# Patient Record
Sex: Male | Born: 1955 | Race: Black or African American | Hispanic: No | Marital: Married | Smoking: Never smoker
Health system: Southern US, Community
[De-identification: ages and names within clinical notes are randomized; demographics above are authoritative.]

## PROBLEM LIST (undated history)

## (undated) DIAGNOSIS — I10 Essential (primary) hypertension: Secondary | ICD-10-CM

## (undated) DIAGNOSIS — E119 Type 2 diabetes mellitus without complications: Secondary | ICD-10-CM

## (undated) DIAGNOSIS — E785 Hyperlipidemia, unspecified: Secondary | ICD-10-CM

## (undated) HISTORY — DX: Essential (primary) hypertension: I10

## (undated) HISTORY — DX: Type 2 diabetes mellitus without complications: E11.9

## (undated) HISTORY — DX: Hyperlipidemia, unspecified: E78.5

---

## 2001-01-22 ENCOUNTER — Ambulatory Visit (HOSPITAL_BASED_OUTPATIENT_CLINIC_OR_DEPARTMENT_OTHER): Admission: RE | Admit: 2001-01-22 | Discharge: 2001-01-22 | Payer: Self-pay | Admitting: Orthopaedic Surgery

## 2003-04-01 ENCOUNTER — Encounter: Admission: RE | Admit: 2003-04-01 | Discharge: 2003-06-30 | Payer: Self-pay | Admitting: Family Medicine

## 2006-12-23 IMAGING — CR DG LUMBAR SPINE COMPLETE 4+V
1 series · 6 of 6 positions shown · non-contrast
Comparison: NONE

CLINICAL DATA: Sciatica pain 

LUMBAR SPINE

[Series 1: view not recorded · 0.17mm/px · 6 of 6 slices shown]
[im 1/6]
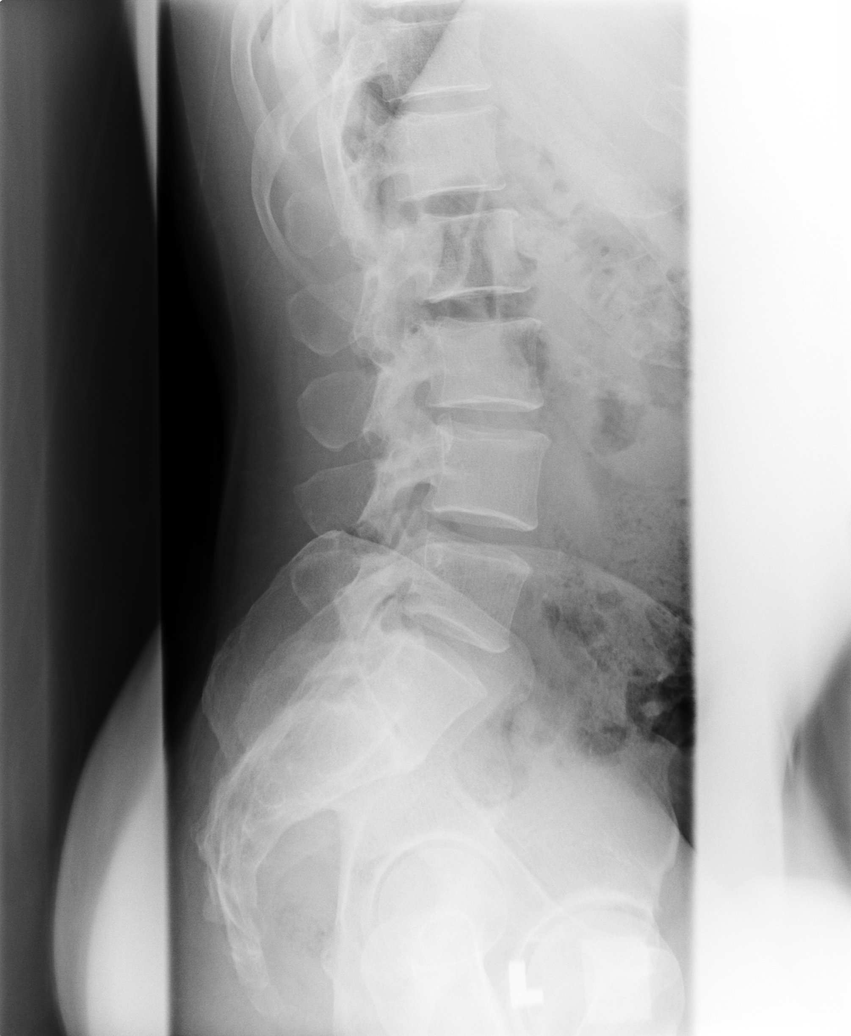
[im 2/6]
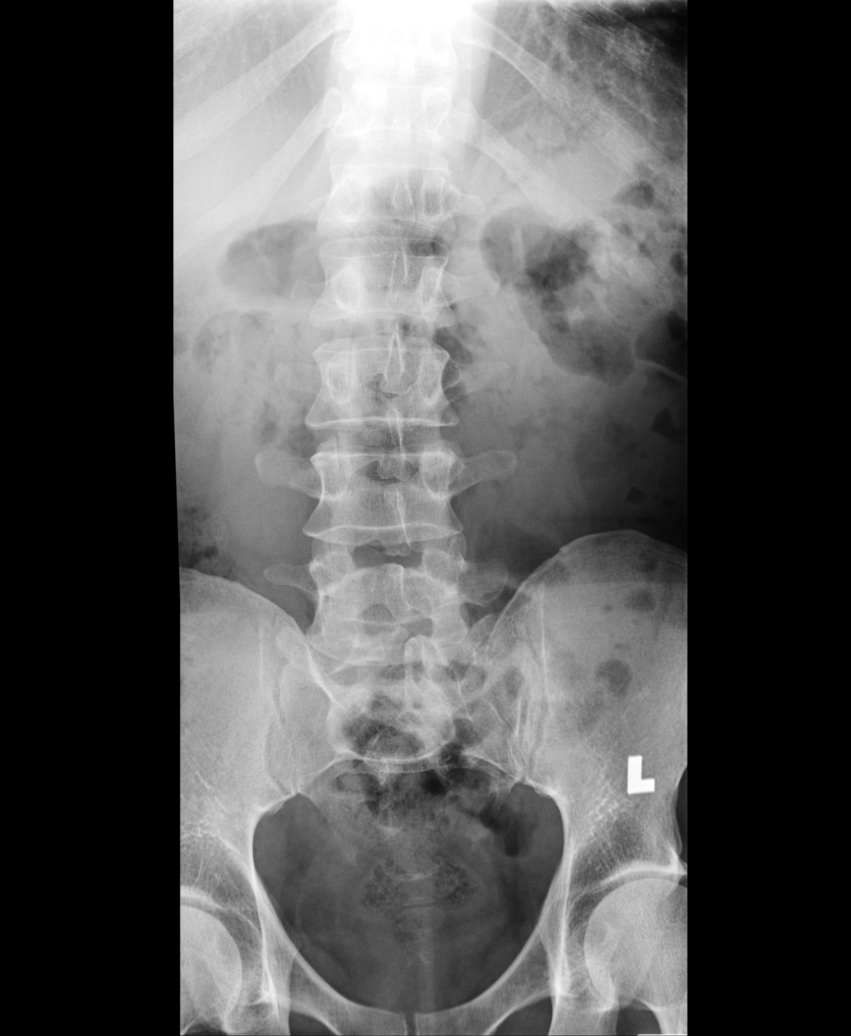
[im 3/6]
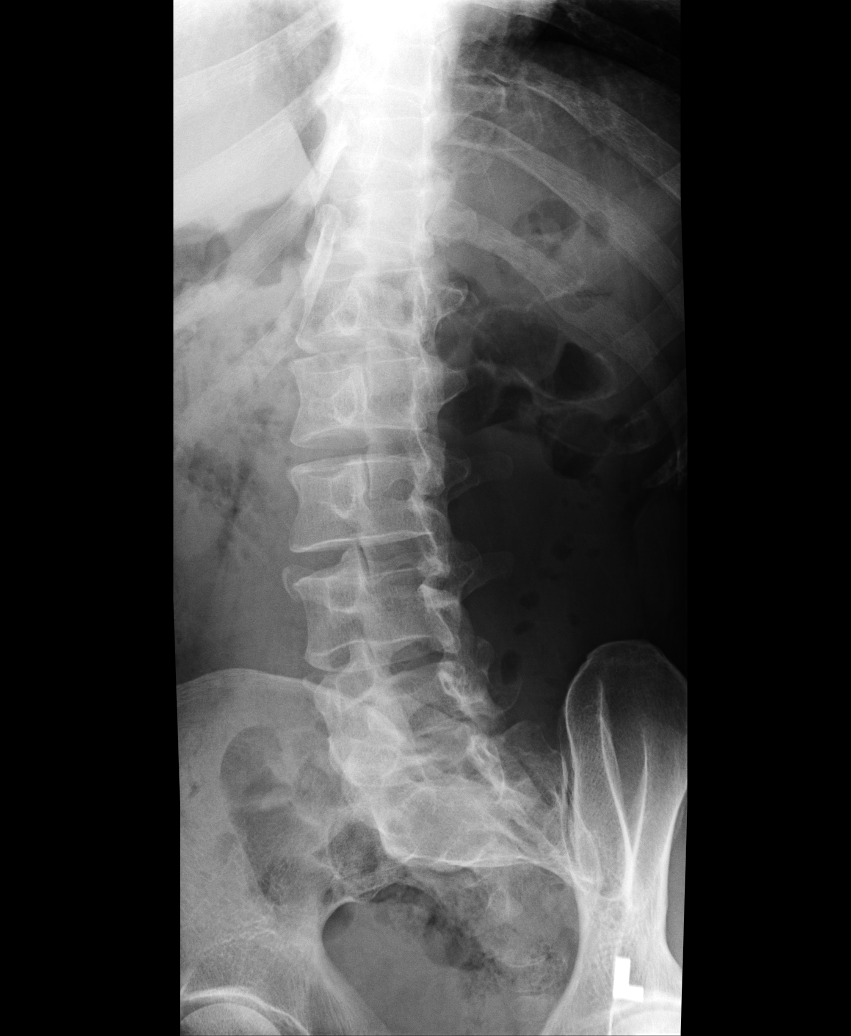
[im 4/6]
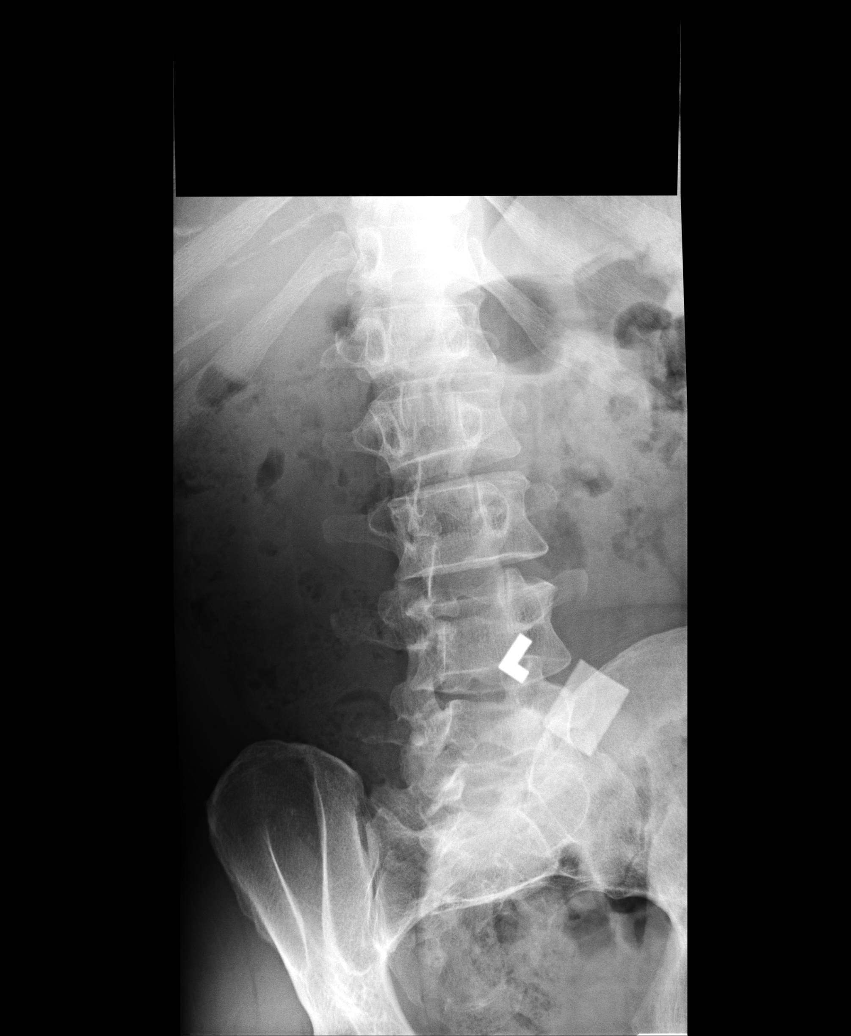
[im 5/6]
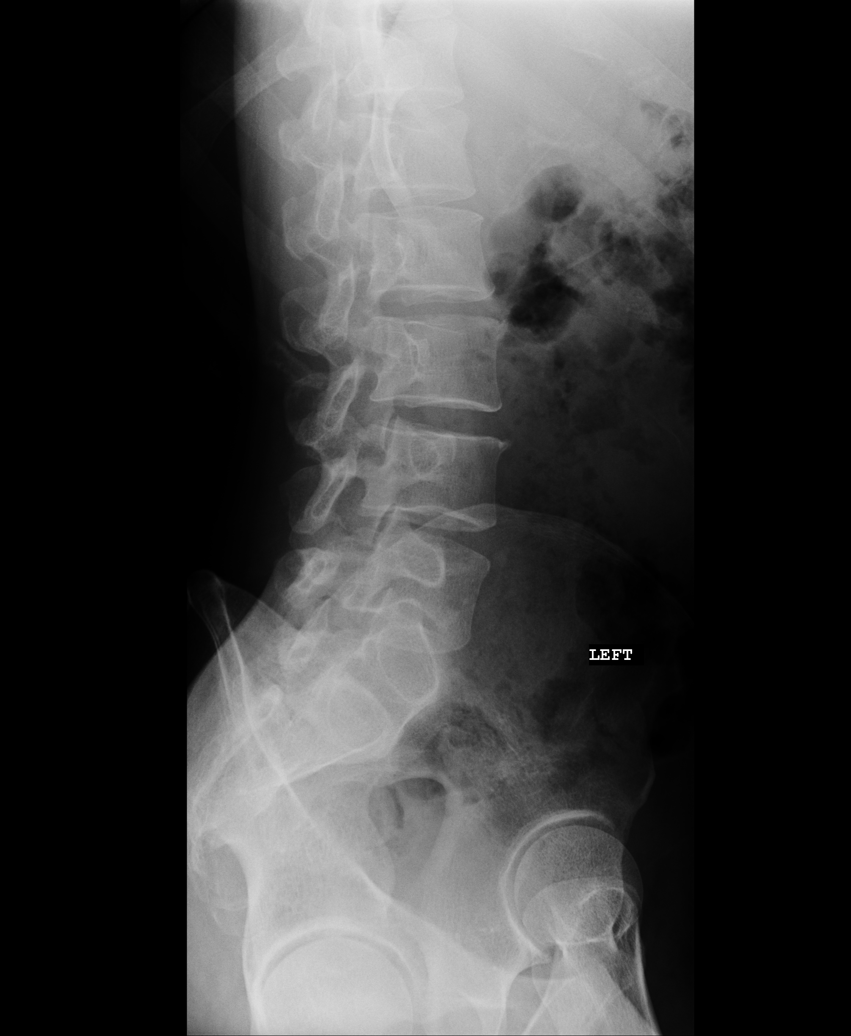
[im 6/6]
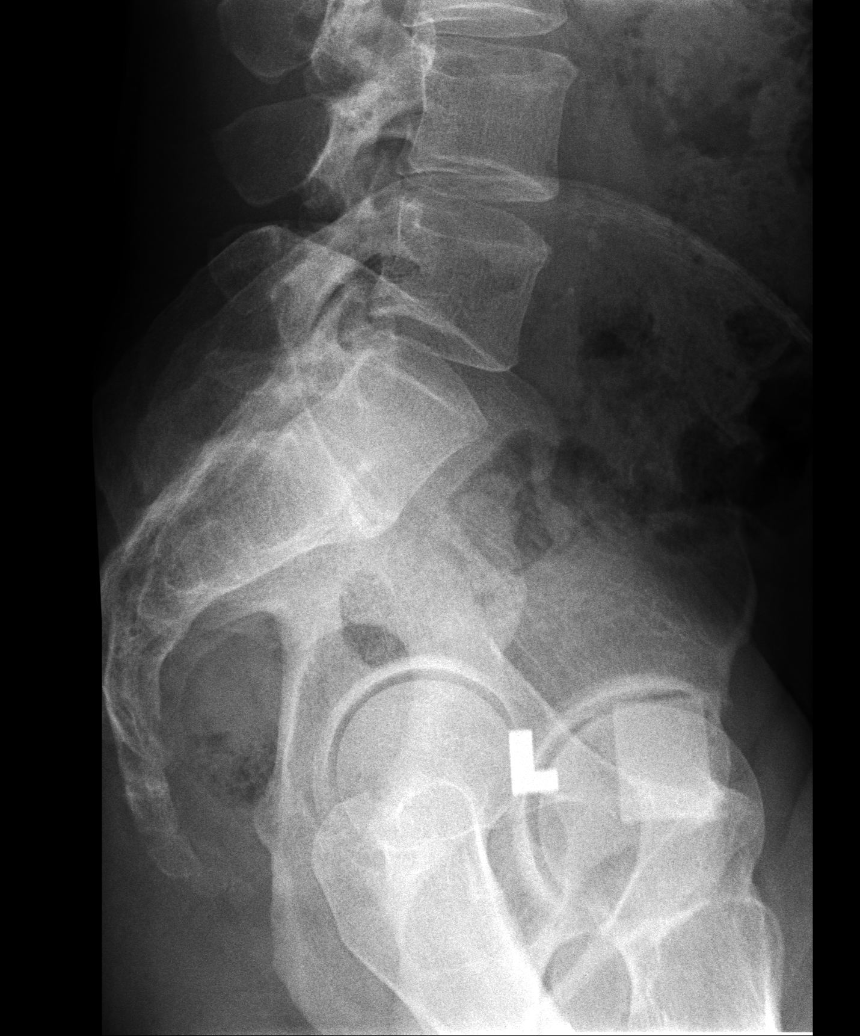

[6 of 6 positions shown; findings below may reference images not displayed]

FINDINGS: Study demonstrates that there is diffuse facet 
arthritic change. No disc height loss or vertebral body deformity 
is seen. Pedicles are seen at all levels.
IMPRESSION: Mild diffuse facet arthritic change. Yesu Ba Ezu, 
Trans Date: 06/01/2006 [REDACTED] [REDACTED]

## 2007-11-18 IMAGING — US US SCROTUM
1 series · 13 of 25 positions shown · non-contrast
Comparison: NONE

CLINICAL DATA: Right testicular irregularity on clinical exam. 

TESTICULAR AND SCROTAL ULTRASOUND

[Series 1: us testicular · 0.08mm/px · 13 of 37 slices shown]
[im 1/37]
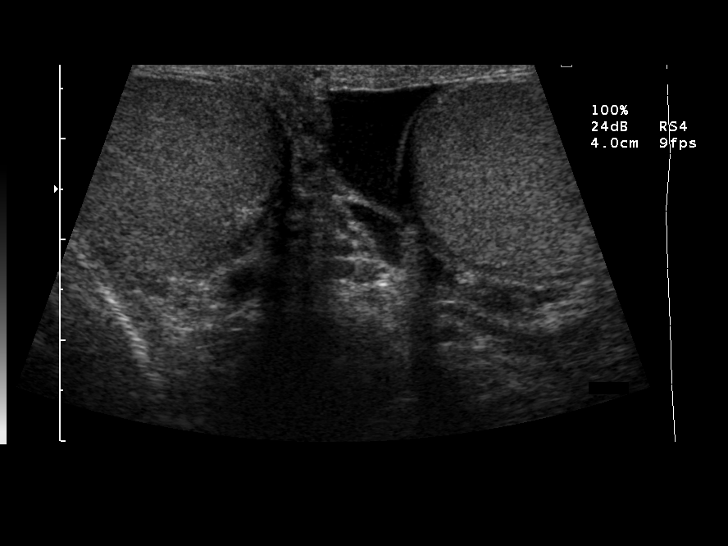
[im 4/37]
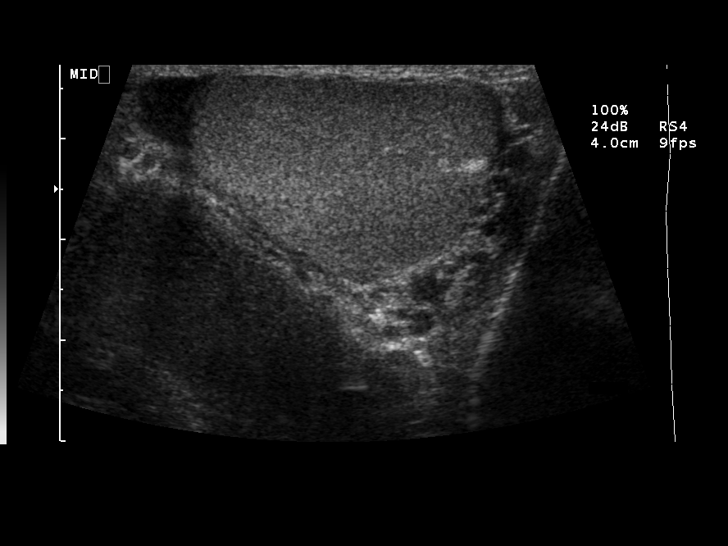
[im 7/37]
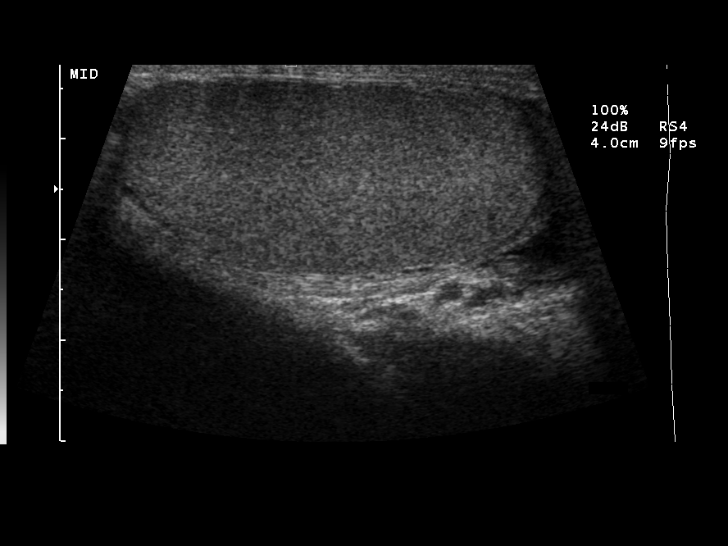
[im 10/37]
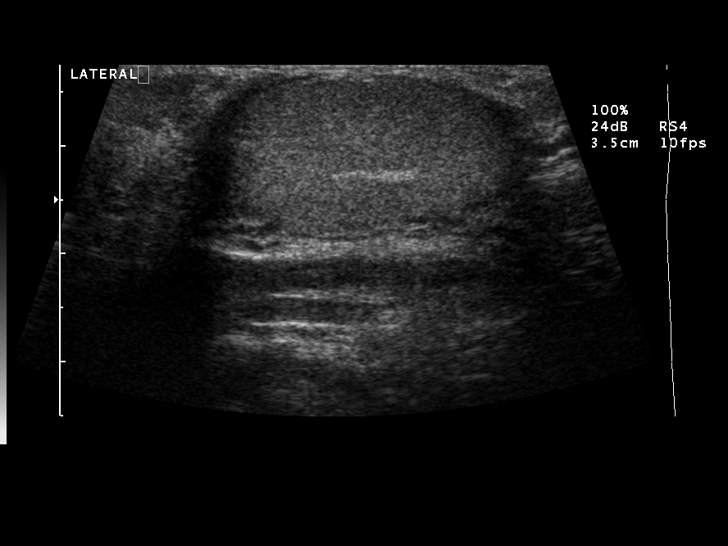
[im 13/37]
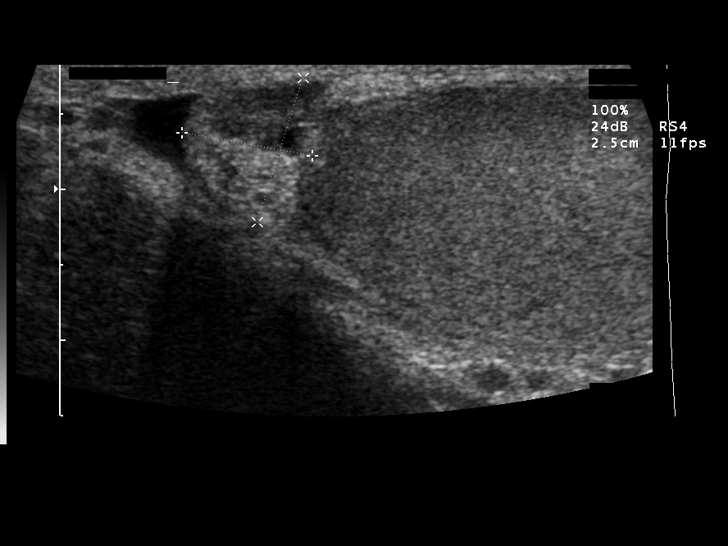
[im 16/37]
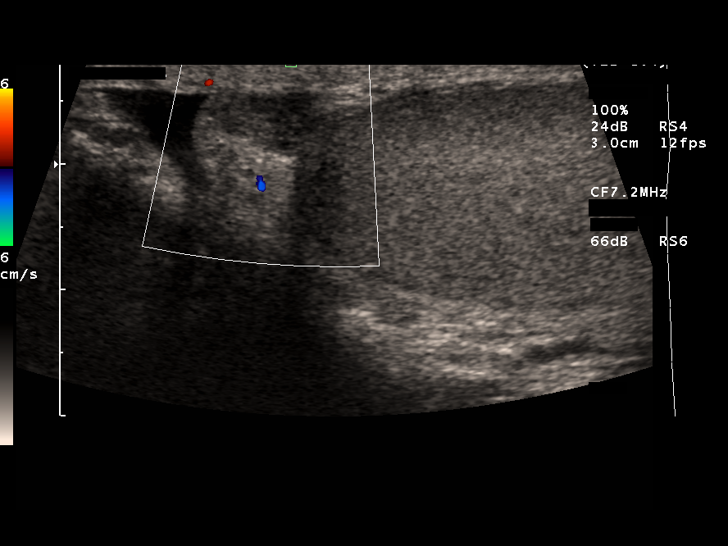
[im 19/37]
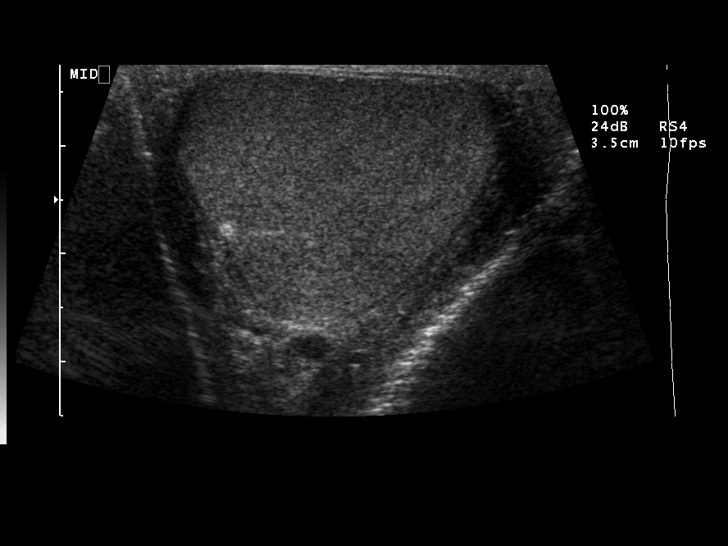
[im 22/37]
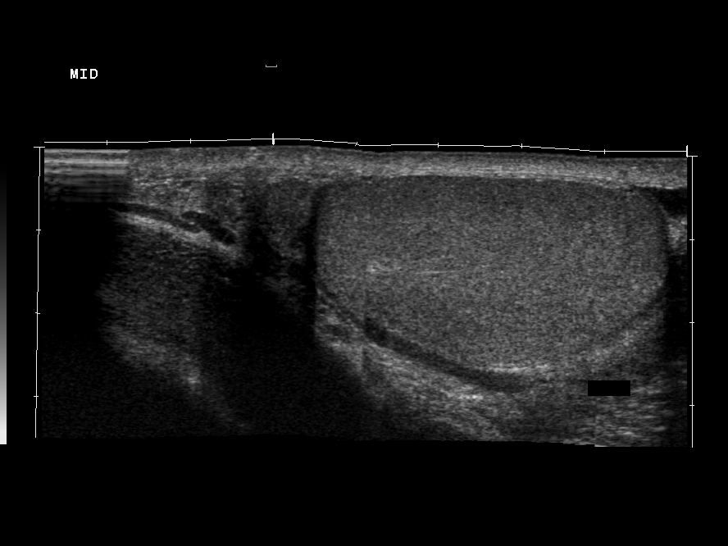
[im 25/37]
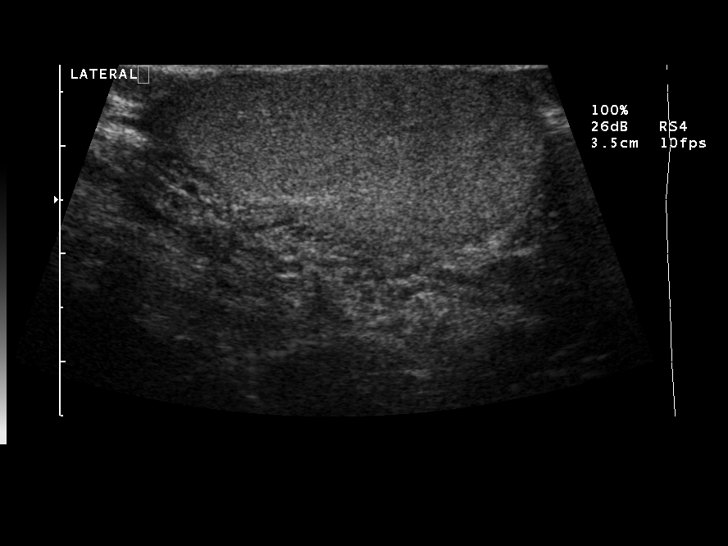
[im 28/37]
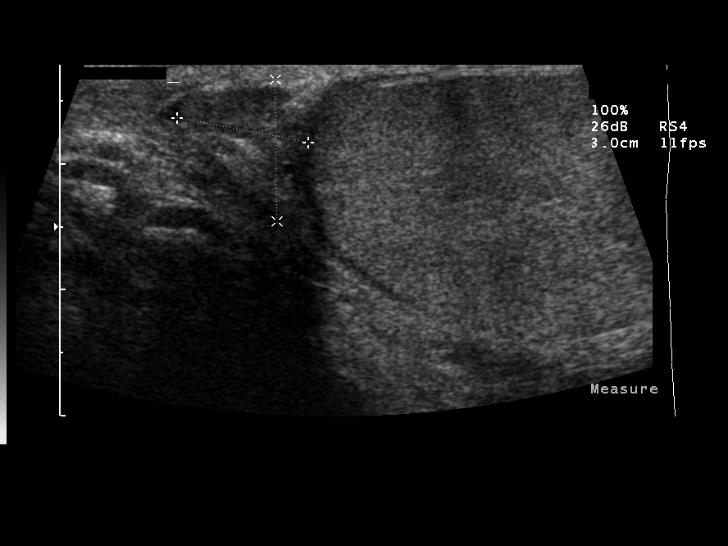
[im 31/37]
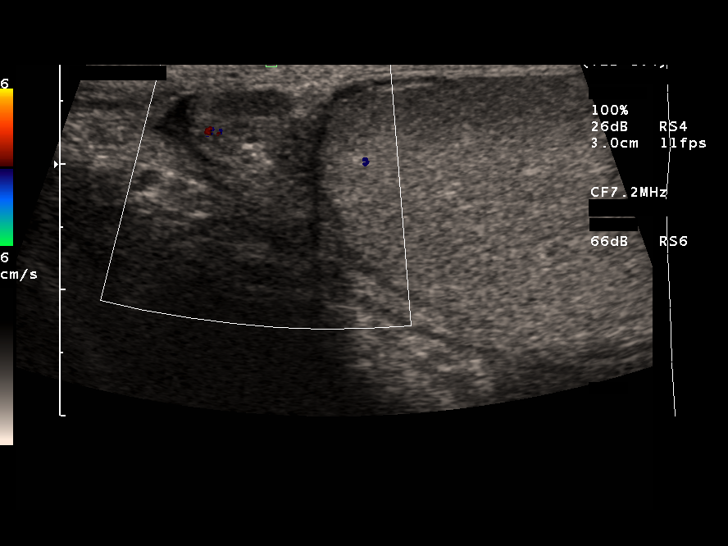
[im 34/37]
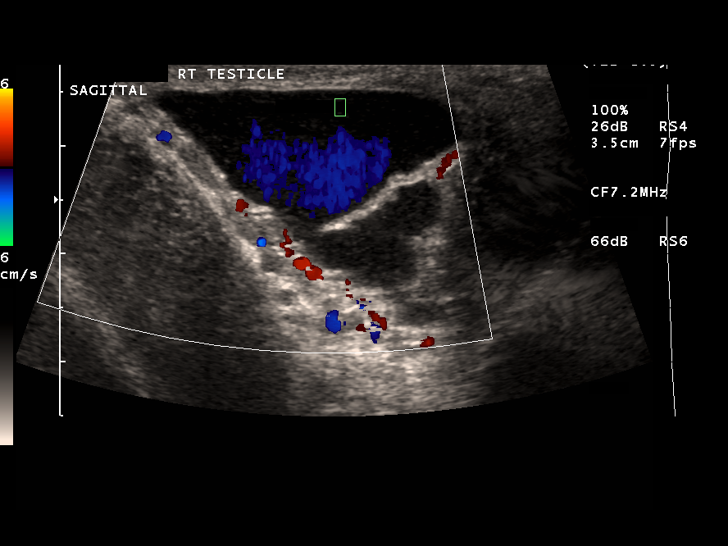
[im 37/37]
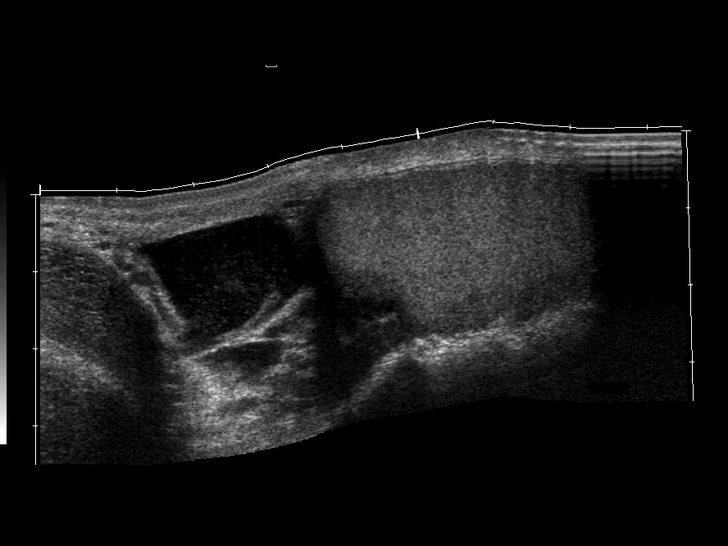

[13 of 25 positions shown; findings below may reference images not displayed]

FINDINGS RIGHT TESTICLE: 4.3 x 2.3 x 3.0 cm LEFT TESTICLE: 4.3 x 
1.9 x 3.0 cm The right testicle was normal in size and echo 
appearance and demonstrated normal vascularity. The right 
epididymal head was normal in size and echo appearance measuring 
1.1 x 1.0 x 1.3 cm. Located superior to the right testicle within 
the scrotal sac, there was a septated fluid collection containing 
low-level echos consistent with debris. Appearances are consistent 
with a hydrocele. This measures 3.9 x 2.1 x 1.0 cm. The left 
testicle was normal in size and echo appearance with normal 
vascularity. The left epididymal head was normal in size measuring 
0.9 x 1.0 x 2.2 cm. Echo heterogeneity was noted. This can be a 
finding indicating prior episodes of inflammation. A moderate 
amount of fluid is seen medial to the left testicle.
IMPRESSION: Normal-appearing testicles bilaterally. Fluid 
collections are noted in both scrotal sacs, predominating on the 
right. The right-side fluid collection contains low-level echos 
consistent with debris, suggesting that this is a chronic finding. 
The fluid does localize toward the medial aspects of both scrotal 
sacs. Heterogeneous echo appearance of the left epididymal head 
suggests probable prior episodes of epididymitis. Masuood Morteza 
Nga Chun, M.D. electronically reviewed on 04/29/2007 Dict Date: 
04/26/2007  Tran Date: 04/29/2007 CAV  [REDACTED]

## 2014-06-05 ENCOUNTER — Ambulatory Visit (INDEPENDENT_AMBULATORY_CARE_PROVIDER_SITE_OTHER): Payer: 59

## 2014-06-05 ENCOUNTER — Encounter: Payer: Self-pay | Admitting: Podiatry

## 2014-06-05 ENCOUNTER — Ambulatory Visit (INDEPENDENT_AMBULATORY_CARE_PROVIDER_SITE_OTHER): Payer: 59 | Admitting: Podiatry

## 2014-06-05 VITALS — BP 127/74 | HR 63 | Resp 11 | Ht 68.0 in | Wt 155.0 lb

## 2014-06-05 DIAGNOSIS — M79673 Pain in unspecified foot: Secondary | ICD-10-CM

## 2014-06-05 DIAGNOSIS — M722 Plantar fascial fibromatosis: Secondary | ICD-10-CM

## 2014-06-05 DIAGNOSIS — Q828 Other specified congenital malformations of skin: Secondary | ICD-10-CM

## 2014-06-05 NOTE — Progress Notes (Addendum)
   Subjective:    Patient ID: Roger Roberts, male    DOB: 20-Nov-1955, 58 y.o.   MRN: [id number]  HPI 58 year old male presents the office with complaints of bilateral foot pain which has been ongoing for approximately 6 months. 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 He states he has pain particularly in the heels bilaterally as well as calluses overlying the bottom of the feet. Patient also states that he has bilateral knee pain as well as right hip pain. Patient also complains of sciatica-like symptoms. Patient is diabetic and he states his last blood sugar is typically in the 120s in the morning. He denies any tearing or numbness to his feet and denies any claudication symptoms. Denies any recent ulcerations. No other complaints at this time.   Review of Systems  All other systems reviewed and are negative.      Objective:   Physical Exam AAO 3, NAD DP/PT pulses palpable bilaterally, CRT less than 3 seconds Protective sensation appears to be intact with Derrel Nip monofilament, vibratory sensation intact, Achilles tendon reflex intact. There is mild tenderness palpation of the plantar medial tubercle of the calcaneus at the insertion of the plantar fascia. There is no pain within the arch of the foot along the course of plantar fascia and the ligament appears to be intact. There is no pain with lateral compression of the calcaneus or pain with vibratory sensation. No pain on the posterior aspect of the calcaneus or along the course/insertion of the Achilles tendon. There is hyperkeratotic lesions bilateral submetatarsal 5 as well as posterior lateral aspect of the right heel. There is no underlying ulcerations or signs of infection. There is no edema, erythema, increased warmth to bilateral lower chemise. No areas of pinpoint tenderness or pain with vibratory sensation. MMT 5/5, ROM WNL No pain with calf compression, swelling,  warmth, erythema.      Assessment & Plan:  58 year old male with bilateral heel pain, likely plantar fasciitis; symptomatic porokeratosis -X-rays were obtained and reviewed with the patient. -Treatment options were discussed the patient including alternatives, risks, complications. -Discussed steroid injections to the heels. Patient wishes to hold off on them at this time. Will consider if no improvements at next appointment.  -Dispensed plantar fascial braces. -Ice to the area. -Discussed stretching exercises. -Discussed shoe gear modification and possible orthotics to help support his foot type. -Porokeratosis sharply debrided 3 without complications. -Follow-up in 2 weeks or sooner should any palms arise. In the meantime, call the office with any questions, concerns, change in symptoms. -Recommended follow-up with orthopedics for hip/knee pain.

## 2014-06-05 NOTE — Patient Instructions (Signed)
Diabetes and Foot Care Diabetes may cause you to have problems because of poor blood supply (circulation) to your feet and legs. This may cause the skin on your feet to become thinner, break easier, and heal more slowly. Your skin may become dry, and the skin may peel and crack. You may also have nerve damage in your legs and feet causing decreased feeling in them. You may not notice minor injuries to your feet that could lead to infections or more serious problems. Taking care of your feet is one of the most important things you can do for yourself.  HOME CARE INSTRUCTIONS  Wear shoes at all times, even in the house. Do not go barefoot. Bare feet are easily injured.  Check your feet daily for blisters, cuts, and redness. If you cannot see the bottom of your feet, use a mirror or ask someone for help.  Wash your feet with warm water (do not use hot water) and mild soap. Then pat your feet and the areas between your toes until they are completely dry. Do not soak your feet as this can dry your skin.  Apply a moisturizing lotion or petroleum jelly (that does not contain alcohol and is unscented) to the skin on your feet and to dry, brittle toenails. Do not apply lotion between your toes.  Trim your toenails straight across. Do not dig under them or around the cuticle. File the edges of your nails with an emery board or nail file.  Do not cut corns or calluses or try to remove them with medicine.  Wear clean socks or stockings every day. Make sure they are not too tight. Do not wear knee-high stockings since they may decrease blood flow to your legs.  Wear shoes that fit properly and have enough cushioning. To break in new shoes, wear them for just a few hours a day. This prevents you from injuring your feet. Always look in your shoes before you put them on to be sure there are no objects inside.  Do not cross your legs. This may decrease the blood flow to your feet.  If you find a minor  scrape, cut, or break in the skin on your feet, keep it and the skin around it clean and dry. These areas may be cleansed with mild soap and water. Do not cleanse the area with peroxide, alcohol, or iodine.  When you remove an adhesive bandage, be sure not to damage the skin around it.  If you have a wound, look at it several times a day to make sure it is healing.  Do not use heating pads or hot water bottles. They may burn your skin. If you have lost feeling in your feet or legs, you may not know it is happening until it is too late.  Make sure your health care provider performs a complete foot exam at least annually or more often if you have foot problems. Report any cuts, sores, or bruises to your health care provider immediately. SEEK MEDICAL CARE IF:   You have an injury that is not healing.  You have cuts or breaks in the skin.  You have an ingrown nail.  You notice redness on your legs or feet.  You feel burning or tingling in your legs or feet.  You have pain or cramps in your legs and feet.  Your legs or feet are numb.  Your feet always feel cold. SEEK IMMEDIATE MEDICAL CARE IF:   There is  increasing redness, swelling, or pain in or around a wound.  There is a red line that goes up your leg.  Pus is coming from a wound.  You develop a fever or as directed by your health care provider.  You notice a bad smell coming from an ulcer or wound. Document Released: 06/02/2000 Document Revised: 02/05/2013 Document Reviewed: 11/12/2012 [facility] Patient Information 2015 [place], [place]. This information is not intended to replace advice given to you by your health care provider. Make sure you discuss any questions you have with your health care provider. Plantar Fasciitis (Heel Spur Syndrome) with Rehab The plantar fascia is a fibrous, ligament-like, soft-tissue structure that spans the bottom of the foot. Plantar fasciitis is a condition that causes pain in the foot due to  inflammation of the tissue. SYMPTOMS   Pain and tenderness on the underneath side of the foot.  Pain that worsens with standing or walking. CAUSES  Plantar fasciitis is caused by irritation and injury to the plantar fascia on the underneath side of the foot. Common mechanisms of injury include:  Direct trauma to bottom of the foot.  Damage to a small nerve that runs under the foot where the main fascia attaches to the heel bone.  Stress placed on the plantar fascia due to bone spurs. RISK INCREASES WITH:   Activities that place stress on the plantar fascia (running, jumping, pivoting, or cutting).  Poor strength and flexibility.  Improperly fitted shoes.  Tight calf muscles.  Flat feet.  Failure to warm-up properly before activity.  Obesity. PREVENTION  Warm up and stretch properly before activity.  Allow for adequate recovery between workouts.  Maintain physical fitness:  Strength, flexibility, and endurance.  Cardiovascular fitness.  Maintain a health body weight.  Avoid stress on the plantar fascia.  Wear properly fitted shoes, including arch supports for individuals who have flat feet. PROGNOSIS  If treated properly, then the symptoms of plantar fasciitis usually resolve without surgery. However, occasionally surgery is necessary. RELATED COMPLICATIONS   Recurrent symptoms that may result in a chronic condition.  Problems of the lower back that are caused by compensating for the injury, such as limping.  Pain or weakness of the foot during push-off following surgery.  Chronic inflammation, scarring, and partial or complete fascia tear, occurring more often from repeated injections. TREATMENT  Treatment initially involves the use of ice and medication to help reduce pain and inflammation. The use of strengthening and stretching exercises may help reduce pain with activity, especially stretches of the Achilles tendon. These exercises may be performed at  home or with a therapist. Your caregiver may recommend that you use heel cups of arch supports to help reduce stress on the plantar fascia. Occasionally, corticosteroid injections are given to reduce inflammation. If symptoms persist for greater than 6 months despite non-surgical (conservative), then surgery may be recommended.  MEDICATION   If pain medication is necessary, then nonsteroidal anti-inflammatory medications, such as aspirin and ibuprofen, or other minor pain relievers, such as acetaminophen, are often recommended.  Do not take pain medication within 7 days before surgery.  Prescription pain relievers may be given if deemed necessary by your caregiver. Use only as directed and only as much as you need.  Corticosteroid injections may be given by your caregiver. These injections should be reserved for the most serious cases, because they may only be given a certain number of times. HEAT AND COLD  Cold treatment (icing) relieves pain and reduces inflammation. Cold treatment should  be applied for 10 to 15 minutes every 2 to 3 hours for inflammation and pain and immediately after any activity that aggravates your symptoms. Use ice packs or massage the area with a piece of ice (ice massage).  Heat treatment may be used prior to performing the stretching and strengthening activities prescribed by your caregiver, physical therapist, or athletic trainer. Use a heat pack or soak the injury in warm water. SEEK IMMEDIATE MEDICAL CARE IF:  Treatment seems to offer no benefit, or the condition worsens.  Any medications produce adverse side effects. EXERCISES RANGE OF MOTION (ROM) AND STRETCHING EXERCISES - Plantar Fasciitis (Heel Spur Syndrome) These exercises may help you when beginning to rehabilitate your injury. Your symptoms may resolve with or without further involvement from your physician, physical therapist or athletic trainer. While completing these exercises, remember:   Restoring  tissue flexibility helps normal motion to return to the joints. This allows healthier, less painful movement and activity.  An effective stretch should be held for at least 30 seconds.  A stretch should never be painful. You should only feel a gentle lengthening or release in the stretched tissue. RANGE OF MOTION - Toe Extension, Flexion  Sit with your right / left leg crossed over your opposite knee.  Grasp your toes and gently pull them back toward the top of your foot. You should feel a stretch on the bottom of your toes and/or foot.  Hold this stretch for __________ seconds.  Now, gently pull your toes toward the bottom of your foot. You should feel a stretch on the top of your toes and or foot.  Hold this stretch for __________ seconds. Repeat __________ times. Complete this stretch __________ times per day.  RANGE OF MOTION - Ankle Dorsiflexion, Active Assisted  Remove shoes and sit on a chair that is preferably not on a carpeted surface.  Place right / left foot under knee. Extend your opposite leg for support.  Keeping your heel down, slide your right / left foot back toward the chair until you feel a stretch at your ankle or calf. If you do not feel a stretch, slide your bottom forward to the edge of the chair, while still keeping your heel down.  Hold this stretch for __________ seconds. Repeat __________ times. Complete this stretch __________ times per day.  STRETCH - Gastroc, Standing  Place hands on wall.  Extend right / left leg, keeping the front knee somewhat bent.  Slightly point your toes inward on your back foot.  Keeping your right / left heel on the floor and your knee straight, shift your weight toward the wall, not allowing your back to arch.  You should feel a gentle stretch in the right / left calf. Hold this position for __________ seconds. Repeat __________ times. Complete this stretch __________ times per day. STRETCH - Soleus, Standing  Place  hands on wall.  Extend right / left leg, keeping the other knee somewhat bent.  Slightly point your toes inward on your back foot.  Keep your right / left heel on the floor, bend your back knee, and slightly shift your weight over the back leg so that you feel a gentle stretch deep in your back calf.  Hold this position for __________ seconds. Repeat __________ times. Complete this stretch __________ times per day. STRETCH - Gastrocsoleus, Standing  Note: This exercise can place a lot of stress on your foot and ankle. Please complete this exercise only if specifically instructed by your caregiver.  Place the ball of your right / left foot on a step, keeping your other foot firmly on the same step.  Hold on to the wall or a rail for balance.  Slowly lift your other foot, allowing your body weight to press your heel down over the edge of the step.  You should feel a stretch in your right / left calf.  Hold this position for __________ seconds.  Repeat this exercise with a slight bend in your right / left knee. Repeat __________ times. Complete this stretch __________ times per day.  STRENGTHENING EXERCISES - Plantar Fasciitis (Heel Spur Syndrome)  These exercises may help you when beginning to rehabilitate your injury. They may resolve your symptoms with or without further involvement from your physician, physical therapist or athletic trainer. While completing these exercises, remember:   Muscles can gain both the endurance and the strength needed for everyday activities through controlled exercises.  Complete these exercises as instructed by your physician, physical therapist or athletic trainer. Progress the resistance and repetitions only as guided. STRENGTH - Towel Curls  Sit in a chair positioned on a non-carpeted surface.  Place your foot on a towel, keeping your heel on the floor.  Pull the towel toward your heel by only curling your toes. Keep your heel on the  floor.  If instructed by your physician, physical therapist or athletic trainer, add ____________________ at the end of the towel. Repeat __________ times. Complete this exercise __________ times per day. STRENGTH - Ankle Inversion  Secure one end of a rubber exercise band/tubing to a fixed object (table, pole). Loop the other end around your foot just before your toes.  Place your fists between your knees. This will focus your strengthening at your ankle.  Slowly, pull your big toe up and in, making sure the band/tubing is positioned to resist the entire motion.  Hold this position for __________ seconds.  Have your muscles resist the band/tubing as it slowly pulls your foot back to the starting position. Repeat __________ times. Complete this exercises __________ times per day.  Document Released: 06/05/2005 Document Revised: 08/28/2011 Document Reviewed: 09/17/2008 [facility] Patient Information 2015 [place], [place]. This information is not intended to replace advice given to you by your health care provider. Make sure you discuss any questions you have with your health care provider.

## 2014-06-08 ENCOUNTER — Encounter: Payer: Self-pay | Admitting: Podiatry

## 2014-06-18 ENCOUNTER — Ambulatory Visit: Payer: 59 | Admitting: Podiatry

## 2014-07-01 ENCOUNTER — Encounter: Payer: Self-pay | Admitting: Podiatry

## 2014-07-01 ENCOUNTER — Ambulatory Visit (INDEPENDENT_AMBULATORY_CARE_PROVIDER_SITE_OTHER): Payer: 59 | Admitting: Podiatry

## 2014-07-01 VITALS — BP 84/54 | HR 75 | Resp 18

## 2014-07-01 DIAGNOSIS — M722 Plantar fascial fibromatosis: Secondary | ICD-10-CM

## 2014-07-01 MED ORDER — DICLOFENAC SODIUM 1 % TD GEL: 2.0000 g | Freq: Four times a day (QID) | TRANSDERMAL | Status: AC

## 2014-07-01 NOTE — Progress Notes (Signed)
Patient ID: Roger Roberts, male   DOB: [birth year], 59 y.o.   MRN: [id number]  Subjective: 59 year old male returns the office they for follow-up evaluation of bilateral heel pain, likely plantar fasciitis. He states that since last appointment his feet are better. He has been continuing the stretching exercises as well as icing. He is continue with shoe gear changes and over-the-counter inserts. No recent injury or trauma to bilateral lower extremities. Denies any systemic complaints such as fevers, chills, nausea, vomiting. No acute changes since last appointment, and no other complaints at this time.   Objective: AAO x3, NAD DP/PT pulses palpable bilaterally, CRT less than 3 seconds Protective sensation intact with Simms Weinstein monofilament, vibratory sensation intact, Achilles tendon reflex intact Is mild discomfort on the plantar aspect of the heel bilaterally at the insertion of the plantar fascial. There is no pain on the course of plantar fascial in the arch of the foot. No pain with lateral compression of the calcaneus or pain the vibratory sensation. No pain on the posterior acid of the calcaneus or along the course/insertion of the Achilles tendon. No other areas of pinpoint bony tenderness or pain with vibratory sensation. MMT 5/5, ROM WNL. No edema, erythema, increase in warmth to bilateral lower extremities.  No open lesions or pre-ulcerative lesions.  No pain with calf compression, swelling, warmth, erythema  Assessment: 59 year old male with resolving bilateral heel pain, likely plantar fasciitis.  Plan: -All treatment options discussed with the patient including all alternatives, risks, complications.  -Discussed possible steroid injection to the area however patient wishes to hold off at this time as his symptoms are improving. -Continue with stretching exercises as well as icing, shoe gear modifications, inserts. -Prescribed Voltaren gel. -Follow-up if the symptoms have not  resolved within 4 weeks or sooner should any problems arise or any changes symptoms. -Patient encouraged to call the office with any questions, concerns, change in symptoms.

## 2015-03-26 ENCOUNTER — Telehealth: Payer: Self-pay | Admitting: *Deleted

## 2015-03-26 NOTE — Telephone Encounter (Signed)
Prior authorization not needed for brand Voltaren 1% Gel, refernce# [id number].  Faxed to [employer] [phone].

## 2016-07-13 DIAGNOSIS — R972 Elevated prostate specific antigen [PSA]: Secondary | ICD-10-CM | POA: Diagnosis not present

## 2016-07-18 DIAGNOSIS — H6692 Otitis media, unspecified, left ear: Secondary | ICD-10-CM | POA: Diagnosis not present

## 2016-08-01 DIAGNOSIS — C61 Malignant neoplasm of prostate: Secondary | ICD-10-CM | POA: Diagnosis not present

## 2016-08-01 DIAGNOSIS — R972 Elevated prostate specific antigen [PSA]: Secondary | ICD-10-CM | POA: Diagnosis not present

## 2016-08-03 ENCOUNTER — Other Ambulatory Visit: Payer: Self-pay | Admitting: Urology

## 2016-08-29 DIAGNOSIS — M6281 Muscle weakness (generalized): Secondary | ICD-10-CM | POA: Diagnosis not present

## 2016-08-29 DIAGNOSIS — M1990 Unspecified osteoarthritis, unspecified site: Secondary | ICD-10-CM | POA: Diagnosis not present

## 2016-08-29 DIAGNOSIS — C61 Malignant neoplasm of prostate: Secondary | ICD-10-CM | POA: Diagnosis not present

## 2016-09-07 NOTE — Patient Instructions (Addendum)
Roger Roberts  09/07/2016   Your procedure is scheduled on: 09-13-16  Report to [facility] Main  Entrance take [facility]  elevators to 3rd floor to  [place] at [phone].  Call this number if you have problems the morning of surgery [phone]   Remember: ONLY 1 PERSON MAY GO WITH YOU TO SHORT STAY TO GET  READY MORNING OF [place].  Do not eat food After Midnight on Monday 09-08-16. Drink plenty of clear liquids all day Tuesday 09-12-16 and follow all of Dr Herrick's bowel prep instructions you. Nothing by mouth after midnight Tuesday !!     Take these medicines the morning of surgery with A SIP OF WATER: none  DO NOT TAKE ANY DIABETIC MEDICATIONS DAY OF YOUR SURGERY                               You may not have any metal on your body including hair pins and              piercings  Do not wear jewelry, make-up, lotions, powders or perfumes, deodorant             Do not wear nail polish.  Do not shave  48 hours prior to surgery.              Men may shave face and neck.   Do not bring valuables to the hospital. [place].  Contacts, dentures or bridgework may not be worn into surgery.  Leave suitcase in the car. After surgery it may be brought to your room.                Please read over the following fact sheets you were given: _____________________________________________________________________    CLEAR LIQUID DIET   Foods Allowed                                                                     Foods Excluded  Coffee and tea, regular and decaf                             liquids that you cannot  Plain Jell-O in any flavor                                             see through such as: Fruit ices (not with fruit pulp)                                     milk, soups, orange juice  Iced Popsicles  All solid food Carbonated beverages, regular and diet                                     Cranberry, grape and apple juices Sports drinks like Gatorade Lightly seasoned clear broth or consume(fat free) Sugar, honey syrup  Sample Menu Breakfast                                Lunch                                     Supper Cranberry juice                    Beef broth                            Chicken broth Jell-O                                     Grape juice                           Apple juice Coffee or tea                        Jell-O                                      Popsicle                                                Coffee or tea                        Coffee or tea  _____________________________________________________________________              How to Manage Your Diabetes Before and After Surgery  Why is it important to control my blood sugar before and after surgery? . Improving blood sugar levels before and after surgery helps healing and can limit problems. . A way of improving blood sugar control is eating a healthy diet by: o  Eating less sugar and carbohydrates o  Increasing activity/exercise o  Talking with your doctor about reaching your blood sugar goals . High blood sugars (greater than 180 mg/dL) can raise your risk of infections and slow your recovery, so you will need to focus on controlling your diabetes during the weeks before surgery. . Make sure that the doctor who takes care of your diabetes knows about your planned surgery including the date and location.  How do I manage my blood sugar before surgery? . Check your blood sugar at least 4 times a day, starting 2 days before surgery, to make sure that the level is not too high or low. o Check your blood sugar the morning of your surgery when you wake up and every 2 hours until you get  to the Short Stay unit. . If your blood sugar is less than 70 mg/dL, you will need to treat for low blood sugar: o Do not take insulin. o Treat a low blood sugar (less than 70  mg/dL) with  cup of clear juice (cranberry or apple), 4 glucose tablets, OR glucose gel. o Recheck blood sugar in 15 minutes after treatment (to make sure it is greater than 70 mg/dL). If your blood sugar is not greater than 70 mg/dL on recheck, call [phone] for further instructions. . Report your blood sugar to the short stay nurse when you get to Short Stay.  . If you are admitted to the hospital after surgery: o Your blood sugar will be checked by the staff and you will probably be given insulin after surgery (instead of oral diabetes medicines) to make sure you have good blood sugar levels. o The goal for blood sugar control after surgery is 80-180 mg/dL.   WHAT DO I DO ABOUT MY DIABETES MEDICATION?  Marland Kitchen Do not take oral diabetes medicines (pills) the morning of surgery.  . THE DAY BEFORE SURGERY 09-12-16,   take only morning or lunch dose of GLIPIZIDE. Do not take any bedtime dose!  Take you METFORMIN as usual   Take you PIOGLITAZONE as usual              . THE MORNING OF SURGERY 09-13-16,   Do not take any GLIPIZIDE, METFORMIN, PIOLGLITAZONE   Patient Signature:  Date:   Nurse Signature:  Date:   Reviewed and Endorsed by [facility] Patient Education Committee, August 2015  [facility] - Preparing for Surgery Before surgery, you can play an important role.  Because skin is not sterile, your skin needs to be as free of germs as possible.  You can reduce the number of germs on your skin by washing with CHG (chlorahexidine gluconate) soap before surgery.  CHG is an antiseptic cleaner which kills germs and bonds with the skin to continue killing germs even after washing. Please DO NOT use if you have an allergy to CHG or antibacterial soaps.  If your skin becomes reddened/irritated stop using the CHG and inform your nurse when you arrive at Short Stay. Do not shave (including legs and underarms) for at least 48 hours prior to the first CHG shower.  You may shave your  face/neck. Please follow these instructions carefully:  1.  Shower with CHG Soap the night before surgery and the  morning of Surgery.  2.  If you choose to wash your hair, wash your hair first as usual with your  normal  shampoo.  3.  After you shampoo, rinse your hair and body thoroughly to remove the  shampoo.                           4.  Use CHG as you would any other liquid soap.  You can apply chg directly  to the skin and wash                       Gently with a scrungie or clean washcloth.  5.  Apply the CHG Soap to your body ONLY FROM THE NECK DOWN.   Do not use on face/ open                           Wound or open sores. Avoid contact  with eyes, ears mouth and genitals (private parts).                       Wash face,  Genitals (private parts) with your normal soap.             6.  Wash thoroughly, paying special attention to the area where your surgery  will be performed.  7.  Thoroughly rinse your body with warm water from the neck down.  8.  DO NOT shower/wash with your normal soap after using and rinsing off  the CHG Soap.                9.  Pat yourself dry with a clean towel.            10.  Wear clean pajamas.            11.  Place clean sheets on your bed the night of your first shower and do not  sleep with pets. Day of Surgery : Do not apply any lotions/deodorants the morning of surgery.  Please wear clean clothes to the hospital/surgery center.  FAILURE TO FOLLOW THESE INSTRUCTIONS MAY RESULT IN THE CANCELLATION OF YOUR SURGERY    WHAT IS A BLOOD TRANSFUSION? Blood Transfusion Information  A transfusion is the replacement of blood or some of its parts. Blood is made up of multiple cells which provide different functions.  Red blood cells carry oxygen and are used for blood loss replacement.  White blood cells fight against infection.  Platelets control bleeding.  Plasma helps clot blood.  Other blood products are available for specialized needs, such as  hemophilia or other clotting disorders. BEFORE THE TRANSFUSION  Who gives blood for transfusions?   Healthy volunteers who are fully evaluated to make sure their blood is safe. This is blood bank blood. Transfusion therapy is the safest it has ever been in the practice of medicine. Before blood is taken from a donor, a complete history is taken to make sure that person has no history of diseases nor engages in risky social behavior (examples are intravenous drug use or sexual activity with multiple partners). The donor's travel history is screened to minimize risk of transmitting infections, such as malaria. The donated blood is tested for signs of infectious diseases, such as HIV and hepatitis. The blood is then tested to be sure it is compatible with you in order to minimize the chance of a transfusion reaction. If you or a relative donates blood, this is often done in anticipation of surgery and is not appropriate for emergency situations. It takes many days to process the donated blood. RISKS AND COMPLICATIONS Although transfusion therapy is very safe and saves many lives, the main dangers of transfusion include:   Getting an infectious disease.  Developing a transfusion reaction. This is an allergic reaction to something in the blood you were given. Every precaution is taken to prevent this. The decision to have a blood transfusion has been considered carefully by your caregiver before blood is given. Blood is not given unless the benefits outweigh the risks. AFTER THE TRANSFUSION  Right after receiving a blood transfusion, you will usually feel much better and more energetic. This is especially true if your red blood cells have gotten low (anemic). The transfusion raises the level of the red blood cells which carry oxygen, and this usually causes an energy increase.  The nurse administering the transfusion will monitor you carefully for complications. HOME  CARE INSTRUCTIONS  No special  instructions are needed after a transfusion. You may find your energy is better. Speak with your caregiver about any limitations on activity for underlying diseases you may have. SEEK MEDICAL CARE IF:   Your condition is not improving after your transfusion.  You develop redness or irritation at the intravenous (IV) site. SEEK IMMEDIATE MEDICAL CARE IF:  Any of the following symptoms occur over the next 12 hours:  Shaking chills.  You have a temperature by mouth above 102 F (38.9 C), not controlled by medicine.  Chest, back, or muscle pain.  People around you feel you are not acting correctly or are confused.  Shortness of breath or difficulty breathing.  Dizziness and fainting.  You get a rash or develop hives.  You have a decrease in urine output.  Your urine turns a dark color or changes to pink, red, or brown. Any of the following symptoms occur over the next 10 days:  You have a temperature by mouth above 102 F (38.9 C), not controlled by medicine.  Shortness of breath.  Weakness after normal activity.  The white part of the eye turns yellow (jaundice).  You have a decrease in the amount of urine or are urinating less often.  Your urine turns a dark color or changes to pink, red, or brown. Document Released: 06/02/2000 Document Revised: 08/28/2011 Document Reviewed: 01/20/2008 [facility] Patient Information 2014 [place], [place].  _______________________________________________________________________

## 2016-09-07 NOTE — Progress Notes (Signed)
LOV Dr Radene Ou 03-07-16 on chart LOV Dr Woody Seller 04-21-16 on chart ECHO 04-04-16 on chart Stress Test 04-03-16 on chart EKG 03-28-16 on chart

## 2016-09-08 ENCOUNTER — Encounter (HOSPITAL_COMMUNITY): Payer: Self-pay

## 2016-09-08 ENCOUNTER — Encounter (HOSPITAL_COMMUNITY)
Admission: RE | Admit: 2016-09-08 | Discharge: 2016-09-08 | Disposition: A | Discharge status: Home / Self Care | Payer: 59 | Source: Ambulatory Visit | Attending: Urology | Admitting: Urology

## 2016-09-08 DIAGNOSIS — Z01818 Encounter for other preprocedural examination: Secondary | ICD-10-CM | POA: Diagnosis not present

## 2016-09-08 DIAGNOSIS — C61 Malignant neoplasm of prostate: Secondary | ICD-10-CM | POA: Insufficient documentation

## 2016-09-08 LAB — CBC
HEMATOCRIT: 41.6 % (ref 39.0–52.0)
Hemoglobin: 13.5 g/dL (ref 13.0–17.0)
MCH: 29 pg (ref 26.0–34.0)
MCHC: 32.5 g/dL (ref 30.0–36.0)
MCV: 89.3 fL (ref 78.0–100.0)
Platelets: 142 10*3/uL — ABNORMAL LOW (ref 150–400)
RBC: 4.66 MIL/uL (ref 4.22–5.81)
RDW: 13.7 % (ref 11.5–15.5)
WBC: 3.7 10*3/uL — ABNORMAL LOW (ref 4.0–10.5)

## 2016-09-08 LAB — BASIC METABOLIC PANEL
Anion gap: 5 (ref 5–15)
BUN: 15 mg/dL (ref 6–20)
CO2: 29 mmol/L (ref 22–32)
Calcium: 8.9 mg/dL (ref 8.9–10.3)
Chloride: 101 mmol/L (ref 101–111)
Creatinine, Ser: 0.91 mg/dL (ref 0.61–1.24)
GFR calc Af Amer: >60 mL/min (ref >60)
GFR calc non Af Amer: >60 mL/min (ref >60)
GLUCOSE: 192 mg/dL — AB (ref 65–99)
POTASSIUM: 4.9 mmol/L (ref 3.5–5.1)
Sodium: 135 mmol/L (ref 135–145)

## 2016-09-08 LAB — ABO/RH: ABO/RH(D): B POS

## 2016-09-08 LAB — GLUCOSE, CAPILLARY: GLUCOSE-CAPILLARY: 180 mg/dL — AB (ref 65–99)

## 2016-09-09 LAB — URINE CULTURE: Culture: NO GROWTH

## 2016-09-13 ENCOUNTER — Ambulatory Visit (HOSPITAL_COMMUNITY): Payer: 59 | Admitting: Certified Registered Nurse Anesthetist

## 2016-09-13 ENCOUNTER — Encounter (HOSPITAL_COMMUNITY): Payer: Self-pay | Admitting: *Deleted

## 2016-09-13 ENCOUNTER — Observation Stay (HOSPITAL_COMMUNITY)
Admission: RE | Admit: 2016-09-13 | Discharge: 2016-09-15 | Disposition: A | Discharge status: Home / Self Care | Payer: 59 | Source: Ambulatory Visit | Attending: Urology | Admitting: Urology

## 2016-09-13 ENCOUNTER — Encounter (HOSPITAL_COMMUNITY)
Admission: RE | Disposition: A | Discharge status: Home / Self Care | Payer: Self-pay | Source: Ambulatory Visit | Attending: Urology

## 2016-09-13 DIAGNOSIS — I10 Essential (primary) hypertension: Secondary | ICD-10-CM | POA: Insufficient documentation

## 2016-09-13 DIAGNOSIS — E119 Type 2 diabetes mellitus without complications: Secondary | ICD-10-CM | POA: Insufficient documentation

## 2016-09-13 DIAGNOSIS — C61 Malignant neoplasm of prostate: Secondary | ICD-10-CM | POA: Diagnosis not present

## 2016-09-13 DIAGNOSIS — R972 Elevated prostate specific antigen [PSA]: Secondary | ICD-10-CM | POA: Diagnosis present

## 2016-09-13 DIAGNOSIS — Z79899 Other long term (current) drug therapy: Secondary | ICD-10-CM | POA: Diagnosis not present

## 2016-09-13 DIAGNOSIS — Z7982 Long term (current) use of aspirin: Secondary | ICD-10-CM | POA: Diagnosis not present

## 2016-09-13 DIAGNOSIS — Z7984 Long term (current) use of oral hypoglycemic drugs: Secondary | ICD-10-CM | POA: Insufficient documentation

## 2016-09-13 HISTORY — PX: ROBOT ASSISTED LAPAROSCOPIC RADICAL PROSTATECTOMY: SHX5141

## 2016-09-13 LAB — TYPE AND SCREEN
ABO/RH(D): B POS
Antibody Screen: NEGATIVE

## 2016-09-13 LAB — GLUCOSE, CAPILLARY
GLUCOSE-CAPILLARY: 173 mg/dL — AB (ref 65–99)
Glucose-Capillary: 151 mg/dL — ABNORMAL HIGH (ref 65–99)
Glucose-Capillary: 329 mg/dL — ABNORMAL HIGH (ref 65–99)
Glucose-Capillary: 316 mg/dL — ABNORMAL HIGH (ref 65–99)

## 2016-09-13 LAB — HEMOGLOBIN AND HEMATOCRIT, BLOOD
HCT: 38.2 % — ABNORMAL LOW (ref 39.0–52.0)
Hemoglobin: 12.6 g/dL — ABNORMAL LOW (ref 13.0–17.0)

## 2016-09-13 SURGERY — PROSTATECTOMY, RADICAL, ROBOT-ASSISTED, LAPAROSCOPIC
Anesthesia: General | Laterality: Bilateral

## 2016-09-13 MED ORDER — LACTATED RINGERS IR SOLN
Status: DC | PRN
  Administered 2016-09-13: 1000 mL

## 2016-09-13 MED ORDER — TRAMADOL HCL 50 MG PO TABS: 50.0000 mg | ORAL_TABLET | Freq: Four times a day (QID) | ORAL | Status: DC | PRN

## 2016-09-13 MED ORDER — BUPIVACAINE-EPINEPHRINE (PF) 0.5% -1:200000 IJ SOLN
INTRAMUSCULAR | Status: AC
  Filled 2016-09-13: qty 30

## 2016-09-13 MED ORDER — HYDROMORPHONE HCL 1 MG/ML IJ SOLN
INTRAMUSCULAR | Status: AC
  Filled 2016-09-13: qty 1

## 2016-09-13 MED ORDER — CEFAZOLIN SODIUM-DEXTROSE 2-4 GM/100ML-% IV SOLN
INTRAVENOUS | Status: AC
  Filled 2016-09-13: qty 100

## 2016-09-13 MED ORDER — TRAMADOL HCL 50 MG PO TABS: 50.0000 mg | ORAL_TABLET | Freq: Four times a day (QID) | ORAL | 0 refills | Status: AC | PRN

## 2016-09-13 MED ORDER — OXYCODONE HCL 5 MG/5ML PO SOLN
5.0000 mg | Freq: Once | ORAL | Status: DC | PRN
  Filled 2016-09-13: qty 5

## 2016-09-13 MED ORDER — LOSARTAN POTASSIUM 50 MG PO TABS
100.0000 mg | ORAL_TABLET | Freq: Every evening | ORAL | Status: DC
  Administered 2016-09-14: 100 mg via ORAL
  Filled 2016-09-13: qty 2

## 2016-09-13 MED ORDER — LIDOCAINE HCL (CARDIAC) 20 MG/ML IV SOLN
INTRAVENOUS | Status: DC | PRN
  Administered 2016-09-13: 100 mg via INTRAVENOUS

## 2016-09-13 MED ORDER — MEPERIDINE HCL 50 MG/ML IJ SOLN: 6.2500 mg | INTRAMUSCULAR | Status: DC | PRN

## 2016-09-13 MED ORDER — HYDROMORPHONE HCL 1 MG/ML IJ SOLN
0.2500 mg | INTRAMUSCULAR | Status: DC | PRN
  Administered 2016-09-13 (×4): 0.5 mg via INTRAVENOUS

## 2016-09-13 MED ORDER — BUPIVACAINE LIPOSOME 1.3 % IJ SUSP
20.0000 mL | Freq: Once | INTRAMUSCULAR | Status: AC
  Administered 2016-09-13: 20 mL
  Filled 2016-09-13: qty 20

## 2016-09-13 MED ORDER — FENTANYL CITRATE (PF) 100 MCG/2ML IJ SOLN
INTRAMUSCULAR | Status: DC | PRN
  Administered 2016-09-13: 50 ug via INTRAVENOUS
  Administered 2016-09-13: 25 ug via INTRAVENOUS
  Administered 2016-09-13: 100 ug via INTRAVENOUS
  Administered 2016-09-13: 25 ug via INTRAVENOUS

## 2016-09-13 MED ORDER — CIPROFLOXACIN IN D5W 400 MG/200ML IV SOLN
INTRAVENOUS | Status: AC
  Filled 2016-09-13: qty 200

## 2016-09-13 MED ORDER — BUPIVACAINE-EPINEPHRINE 0.5% -1:200000 IJ SOLN
INTRAMUSCULAR | Status: DC | PRN
  Administered 2016-09-13: 8 mL

## 2016-09-13 MED ORDER — SUGAMMADEX SODIUM 500 MG/5ML IV SOLN
INTRAVENOUS | Status: DC | PRN
  Administered 2016-09-13: 300 mg via INTRAVENOUS

## 2016-09-13 MED ORDER — CEFAZOLIN SODIUM-DEXTROSE 2-4 GM/100ML-% IV SOLN
2.0000 g | INTRAVENOUS | Status: AC
  Administered 2016-09-13: 2 g via INTRAVENOUS

## 2016-09-13 MED ORDER — SUGAMMADEX SODIUM 200 MG/2ML IV SOLN
INTRAVENOUS | Status: AC
  Filled 2016-09-13: qty 2

## 2016-09-13 MED ORDER — ONDANSETRON HCL 4 MG/2ML IJ SOLN
INTRAMUSCULAR | Status: AC
  Filled 2016-09-13: qty 2

## 2016-09-13 MED ORDER — SIMVASTATIN 40 MG PO TABS
40.0000 mg | ORAL_TABLET | Freq: Every day | ORAL | Status: DC
  Administered 2016-09-13 – 2016-09-14 (×2): 40 mg via ORAL
  Filled 2016-09-13 (×2): qty 1

## 2016-09-13 MED ORDER — DOCUSATE SODIUM 100 MG PO CAPS
100.0000 mg | ORAL_CAPSULE | Freq: Two times a day (BID) | ORAL | Status: DC
  Administered 2016-09-13 – 2016-09-15 (×4): 100 mg via ORAL
  Filled 2016-09-13 (×5): qty 1

## 2016-09-13 MED ORDER — SODIUM CHLORIDE 0.9 % IJ SOLN
INTRAMUSCULAR | Status: DC | PRN
  Administered 2016-09-13: 20 mL

## 2016-09-13 MED ORDER — INSULIN ASPART 100 UNIT/ML ~~LOC~~ SOLN
SUBCUTANEOUS | Status: AC
  Administered 2016-09-13: 8 [IU] via SUBCUTANEOUS
  Filled 2016-09-13: qty 1

## 2016-09-13 MED ORDER — DEXAMETHASONE SODIUM PHOSPHATE 10 MG/ML IJ SOLN
INTRAMUSCULAR | Status: AC
  Filled 2016-09-13: qty 1

## 2016-09-13 MED ORDER — EPHEDRINE SULFATE 50 MG/ML IJ SOLN: INTRAMUSCULAR | Status: DC | PRN

## 2016-09-13 MED ORDER — OXYCODONE HCL 5 MG PO TABS: 5.0000 mg | ORAL_TABLET | ORAL | Status: DC | PRN

## 2016-09-13 MED ORDER — STERILE WATER FOR IRRIGATION IR SOLN
Status: DC | PRN
  Administered 2016-09-13: 1000 mL

## 2016-09-13 MED ORDER — LACTATED RINGERS IV SOLN
INTRAVENOUS | Status: DC | PRN
  Administered 2016-09-13 (×2): via INTRAVENOUS

## 2016-09-13 MED ORDER — LACTATED RINGERS IV BOLUS (SEPSIS)
500.0000 mL | Freq: Once | INTRAVENOUS | Status: AC
  Administered 2016-09-13: 500 mL via INTRAVENOUS

## 2016-09-13 MED ORDER — HYDROMORPHONE HCL 1 MG/ML IJ SOLN
0.5000 mg | INTRAMUSCULAR | Status: DC | PRN
  Administered 2016-09-14 – 2016-09-15 (×3): 1 mg via INTRAVENOUS
  Filled 2016-09-13 (×3): qty 1

## 2016-09-13 MED ORDER — MIDAZOLAM HCL 5 MG/5ML IJ SOLN
INTRAMUSCULAR | Status: DC | PRN
  Administered 2016-09-13 (×2): 1 mg via INTRAVENOUS

## 2016-09-13 MED ORDER — ONDANSETRON HCL 4 MG/2ML IJ SOLN
INTRAMUSCULAR | Status: DC | PRN
  Administered 2016-09-13: 4 mg via INTRAVENOUS

## 2016-09-13 MED ORDER — LIDOCAINE 2% (20 MG/ML) 5 ML SYRINGE
INTRAMUSCULAR | Status: AC
  Filled 2016-09-13: qty 5

## 2016-09-13 MED ORDER — PROPOFOL 10 MG/ML IV BOLUS
INTRAVENOUS | Status: AC
  Filled 2016-09-13: qty 20

## 2016-09-13 MED ORDER — SENNA 8.6 MG PO TABS
1.0000 | ORAL_TABLET | Freq: Two times a day (BID) | ORAL | Status: DC
  Administered 2016-09-13 – 2016-09-15 (×4): 8.6 mg via ORAL
  Filled 2016-09-13 (×5): qty 1

## 2016-09-13 MED ORDER — INSULIN ASPART 100 UNIT/ML ~~LOC~~ SOLN
0.0000 [IU] | Freq: Three times a day (TID) | SUBCUTANEOUS | Status: DC
  Administered 2016-09-13 – 2016-09-14 (×3): 3 [IU] via SUBCUTANEOUS

## 2016-09-13 MED ORDER — ONDANSETRON HCL 4 MG/2ML IJ SOLN
4.0000 mg | INTRAMUSCULAR | Status: DC | PRN
  Administered 2016-09-14: 4 mg via INTRAVENOUS
  Filled 2016-09-13: qty 2

## 2016-09-13 MED ORDER — LACTATED RINGERS IV SOLN
INTRAVENOUS | Status: DC | PRN
  Administered 2016-09-13 (×2): via INTRAVENOUS

## 2016-09-13 MED ORDER — LIDOCAINE HCL (CARDIAC) 20 MG/ML IV SOLN: INTRAVENOUS | Status: DC | PRN

## 2016-09-13 MED ORDER — CEFAZOLIN IN D5W 1 GM/50ML IV SOLN
1.0000 g | Freq: Three times a day (TID) | INTRAVENOUS | Status: AC
  Administered 2016-09-13 – 2016-09-14 (×2): 1 g via INTRAVENOUS
  Filled 2016-09-13 (×2): qty 50

## 2016-09-13 MED ORDER — PROPOFOL 10 MG/ML IV BOLUS
INTRAVENOUS | Status: DC | PRN
  Administered 2016-09-13: 150 mg via INTRAVENOUS
  Administered 2016-09-13: 50 mg via INTRAVENOUS

## 2016-09-13 MED ORDER — EPHEDRINE SULFATE 50 MG/ML IJ SOLN
INTRAMUSCULAR | Status: DC | PRN
  Administered 2016-09-13 (×2): 5 mg via INTRAVENOUS

## 2016-09-13 MED ORDER — OXYCODONE HCL 5 MG PO TABS: 5.0000 mg | ORAL_TABLET | Freq: Once | ORAL | Status: DC | PRN

## 2016-09-13 MED ORDER — KETOROLAC TROMETHAMINE 15 MG/ML IJ SOLN
INTRAMUSCULAR | Status: AC
  Administered 2016-09-13: 15 mg
  Filled 2016-09-13: qty 1

## 2016-09-13 MED ORDER — SULFAMETHOXAZOLE-TRIMETHOPRIM 800-160 MG PO TABS: 1.0000 | ORAL_TABLET | Freq: Two times a day (BID) | ORAL | 0 refills | Status: AC

## 2016-09-13 MED ORDER — DEXAMETHASONE SODIUM PHOSPHATE 10 MG/ML IJ SOLN
INTRAMUSCULAR | Status: DC | PRN
  Administered 2016-09-13: 10 mg via INTRAVENOUS

## 2016-09-13 MED ORDER — PROMETHAZINE HCL 25 MG/ML IJ SOLN: 6.2500 mg | INTRAMUSCULAR | Status: DC | PRN

## 2016-09-13 MED ORDER — MIDAZOLAM HCL 2 MG/2ML IJ SOLN
INTRAMUSCULAR | Status: AC
  Filled 2016-09-13: qty 2

## 2016-09-13 MED ORDER — POTASSIUM CHLORIDE IN NACL 20-0.45 MEQ/L-% IV SOLN
INTRAVENOUS | Status: DC
  Administered 2016-09-13 – 2016-09-14 (×2): via INTRAVENOUS
  Filled 2016-09-13 (×2): qty 1000

## 2016-09-13 MED ORDER — CIPROFLOXACIN IN D5W 400 MG/200ML IV SOLN
400.0000 mg | INTRAVENOUS | Status: AC
  Administered 2016-09-13: 400 mg via INTRAVENOUS

## 2016-09-13 MED ORDER — DIPHENHYDRAMINE HCL 50 MG/ML IJ SOLN: 12.5000 mg | Freq: Four times a day (QID) | INTRAMUSCULAR | Status: DC | PRN

## 2016-09-13 MED ORDER — SODIUM CHLORIDE 0.9 % IJ SOLN
INTRAMUSCULAR | Status: AC
  Filled 2016-09-13: qty 20

## 2016-09-13 MED ORDER — OXYBUTYNIN CHLORIDE 5 MG PO TABS: 5.0000 mg | ORAL_TABLET | Freq: Three times a day (TID) | ORAL | Status: DC | PRN

## 2016-09-13 MED ORDER — SODIUM CHLORIDE 0.9 % IV BOLUS (SEPSIS)
1000.0000 mL | Freq: Once | INTRAVENOUS | Status: AC
  Administered 2016-09-13: 1000 mL via INTRAVENOUS

## 2016-09-13 MED ORDER — PROPOFOL 10 MG/ML IV BOLUS: INTRAVENOUS | Status: DC | PRN

## 2016-09-13 MED ORDER — ACETAMINOPHEN 325 MG PO TABS: 650.0000 mg | ORAL_TABLET | Freq: Four times a day (QID) | ORAL | Status: DC

## 2016-09-13 MED ORDER — FENTANYL CITRATE (PF) 250 MCG/5ML IJ SOLN
INTRAMUSCULAR | Status: AC
  Filled 2016-09-13: qty 5

## 2016-09-13 MED ORDER — DIPHENHYDRAMINE HCL 12.5 MG/5ML PO ELIX: 12.5000 mg | ORAL_SOLUTION | Freq: Four times a day (QID) | ORAL | Status: DC | PRN

## 2016-09-13 MED ORDER — KETOROLAC TROMETHAMINE 15 MG/ML IJ SOLN
15.0000 mg | Freq: Four times a day (QID) | INTRAMUSCULAR | Status: AC
  Administered 2016-09-13 – 2016-09-14 (×6): 15 mg via INTRAVENOUS
  Filled 2016-09-13 (×6): qty 1

## 2016-09-13 MED ORDER — ROCURONIUM BROMIDE 100 MG/10ML IV SOLN
INTRAVENOUS | Status: DC | PRN
  Administered 2016-09-13 (×2): 10 mg via INTRAVENOUS
  Administered 2016-09-13: 50 mg via INTRAVENOUS
  Administered 2016-09-13: 5 mg via INTRAVENOUS

## 2016-09-13 MED ORDER — ROCURONIUM BROMIDE 50 MG/5ML IV SOSY
PREFILLED_SYRINGE | INTRAVENOUS | Status: AC
  Filled 2016-09-13: qty 5

## 2016-09-13 MED ORDER — ACETAMINOPHEN 10 MG/ML IV SOLN
1000.0000 mg | Freq: Four times a day (QID) | INTRAVENOUS | Status: AC
  Administered 2016-09-13 – 2016-09-14 (×4): 1000 mg via INTRAVENOUS
  Filled 2016-09-13 (×4): qty 100

## 2016-09-13 SURGICAL SUPPLY — 56 items
ADH SKN CLS APL DERMABOND .7 (GAUZE/BANDAGES/DRESSINGS) ×1
APL SRG 38 LTWT LNG FL B (MISCELLANEOUS) ×1
APPLICATOR ARISTA FLEXITIP XL (MISCELLANEOUS) ×2 IMPLANT
CATH FOLEY 2WAY SLVR 18FR 30CC (CATHETERS) ×3 IMPLANT
CATH TIEMANN FOLEY 18FR 5CC (CATHETERS) ×3 IMPLANT
CHLORAPREP W/TINT 26ML (MISCELLANEOUS) ×3 IMPLANT
CLIP LIGATING HEM O LOK PURPLE (MISCELLANEOUS) ×8 IMPLANT
COVER SURGICAL LIGHT HANDLE (MISCELLANEOUS) ×3 IMPLANT
COVER TIP SHEARS 8 DVNC (MISCELLANEOUS) ×1 IMPLANT
COVER TIP SHEARS 8MM DA VINCI (MISCELLANEOUS) ×4
CUTTER ECHEON FLEX ENDO 45 340 (ENDOMECHANICALS) ×3 IMPLANT
DECANTER SPIKE VIAL GLASS SM (MISCELLANEOUS) ×3 IMPLANT
DERMABOND ADVANCED (GAUZE/BANDAGES/DRESSINGS) ×2
DERMABOND ADVANCED .7 DNX12 (GAUZE/BANDAGES/DRESSINGS) ×1 IMPLANT
DRAPE ARM DVNC X/XI (DISPOSABLE) ×4 IMPLANT
DRAPE COLUMN DVNC XI (DISPOSABLE) ×1 IMPLANT
DRAPE DA VINCI XI ARM (DISPOSABLE) ×8
DRAPE DA VINCI XI COLUMN (DISPOSABLE) ×2
DRAPE SURG IRRIG POUCH 19X23 (DRAPES) ×3 IMPLANT
DRSG TEGADERM 4X4.75 (GAUZE/BANDAGES/DRESSINGS) ×3 IMPLANT
ELECT REM PT RETURN 15FT ADLT (MISCELLANEOUS) ×3 IMPLANT
GAUZE SPONGE 2X2 8PLY STRL LF (GAUZE/BANDAGES/DRESSINGS) IMPLANT
GLOVE BIO SURGEON STRL SZ 6.5 (GLOVE) ×2 IMPLANT
GLOVE BIO SURGEONS STRL SZ 6.5 (GLOVE) ×1
GLOVE BIOGEL M STRL SZ7.5 (GLOVE) ×6 IMPLANT
GOWN STRL REUS W/TWL LRG LVL3 (GOWN DISPOSABLE) ×3 IMPLANT
GOWN STRL REUS W/TWL XL LVL3 (GOWN DISPOSABLE) ×6 IMPLANT
HEMOSTAT ARISTA ABSORB 3G PWDR (MISCELLANEOUS) ×2 IMPLANT
HOLDER FOLEY CATH W/STRAP (MISCELLANEOUS) ×3 IMPLANT
IRRIG SUCT STRYKERFLOW 2 WTIP (MISCELLANEOUS) ×3
IRRIGATION SUCT STRKRFLW 2 WTP (MISCELLANEOUS) ×1 IMPLANT
IV LACTATED RINGERS 1000ML (IV SOLUTION) ×3 IMPLANT
PACK ROBOT UROLOGY CUSTOM (CUSTOM PROCEDURE TRAY) ×3 IMPLANT
PAD POSITIONING PINK XL (MISCELLANEOUS) ×2 IMPLANT
PORT ACCESS TROCAR AIRSEAL 12 (TROCAR) IMPLANT
PORT ACCESS TROCAR AIRSEAL 5M (TROCAR)
RELOAD GREEN ECHELON 45 (STAPLE) ×3 IMPLANT
SEAL CANN UNIV 5-8 DVNC XI (MISCELLANEOUS) ×4 IMPLANT
SEAL XI 5MM-8MM UNIVERSAL (MISCELLANEOUS) ×8
SET TRI-LUMEN FLTR TB AIRSEAL (TUBING) ×2 IMPLANT
SOLUTION ELECTROLUBE (MISCELLANEOUS) ×3 IMPLANT
SPONGE GAUZE 2X2 STER 10/PKG (GAUZE/BANDAGES/DRESSINGS)
SUT ETHILON 3 0 PS 1 (SUTURE) ×3 IMPLANT
SUT MNCRL AB 4-0 PS2 18 (SUTURE) ×6 IMPLANT
SUT V-LOC BARB 180 2/0GR6 GS22 (SUTURE) ×3
SUT VIC AB 0 CT1 27 (SUTURE) ×3
SUT VIC AB 0 CT1 27XBRD ANTBC (SUTURE) ×1 IMPLANT
SUT VIC AB 2-0 SH 27 (SUTURE) ×3
SUT VIC AB 2-0 SH 27X BRD (SUTURE) ×1 IMPLANT
SUT VICRYL 0 UR6 27IN ABS (SUTURE) ×6 IMPLANT
SUT VLOC BARB 180 ABS3/0GR12 (SUTURE) ×6
SUTURE V-LC BRB 180 2/0GR6GS22 (SUTURE) IMPLANT
SUTURE VLOC BRB 180 ABS3/0GR12 (SUTURE) ×2 IMPLANT
TOWEL OR NON WOVEN STRL DISP B (DISPOSABLE) ×3 IMPLANT
WATER STERILE IRR 1000ML POUR (IV SOLUTION) ×2 IMPLANT
WATER STERILE IRR 1500ML POUR (IV SOLUTION) ×1 IMPLANT

## 2016-09-13 NOTE — Care Management Note (Signed)
Case Management Note  Patient Details  Name: Roger Roberts MRN: [id number] Date of Birth: Aug 20, 1955  Subjective/Objective:  61 y/o m admitted w/Prostate Ca. s/p Prostatectomy. From home.                  Action/Plan:d/c plan home.   Expected Discharge Date:                  Expected Discharge Plan:  Home/Self Care  In-House Referral:     Discharge planning Services  CM Consult  Post Acute Care Choice:    Choice offered to:     DME Arranged:    DME Agency:     HH Arranged:    HH Agency:     Status of Service:  In process, will continue to follow  If discussed at Long Length of Stay Meetings, dates discussed:    Additional Comments:  Dessa Phi, RN 09/13/2016, 3:01 PM

## 2016-09-13 NOTE — Transfer of Care (Signed)
Immediate Anesthesia Transfer of Care Note  Patient: Marye Round  Procedure(s) Performed: Procedure(s): XI ROBOTIC ASSISTED LAPAROSCOPIC RADICAL PROSTATECTOMY  WITH BILATERAL PELVIC NODE DISSECTION (Bilateral)  Patient Location: PACU  Anesthesia Type:General  Level of Consciousness: awake, oriented, patient cooperative, lethargic and responds to stimulation  Airway & Oxygen Therapy: Patient Spontanous Breathing and Patient connected to face mask oxygen  Post-op Assessment: Report given to RN, Post -op Vital signs reviewed and stable and Patient moving all extremities  Post vital signs: Reviewed and stable  Last Vitals:  Vitals:   09/13/16 0530  BP: (!) 155/81  Pulse: 70  Resp: 16  Temp: 36.6 C    Last Pain:  Vitals:   09/13/16 0530  TempSrc: Oral         Complications: No apparent anesthesia complications

## 2016-09-13 NOTE — Anesthesia Procedure Notes (Signed)
Procedure Name: Intubation Date/Time: 09/13/2016 7:26 AM Performed by: Candida Peeling RAY Pre-anesthesia Checklist: Patient identified, Emergency Drugs available, Suction available, Patient being monitored and Timeout performed Patient Re-evaluated:Patient Re-evaluated prior to inductionOxygen Delivery Method: Circle system utilized Preoxygenation: Pre-oxygenation with 100% oxygen Intubation Type: IV induction and Cricoid Pressure applied Ventilation: Mask ventilation without difficulty and Oral airway inserted - appropriate to patient size Laryngoscope Size: Mac and 4 Grade View: Grade III Tube type: Oral Tube size: 8.0 mm Number of attempts: 1 Airway Equipment and Method: Stylet Placement Confirmation: positive ETCO2 and breath sounds checked- equal and bilateral Secured at: 22 cm Tube secured with: Tape Dental Injury: Teeth and Oropharynx as per pre-operative assessment  Difficulty Due To: Difficulty was anticipated, Difficult Airway- due to anterior larynx and Difficult Airway- due to limited oral opening Future Recommendations: Recommend- induction with short-acting agent, and alternative techniques readily available Comments: Arytenoids visualized with head lift/cricoid pressure, anterior.

## 2016-09-13 NOTE — Anesthesia Preprocedure Evaluation (Signed)
Anesthesia Evaluation  Patient identified by MRN, date of birth, ID band Patient awake    Reviewed: Allergy & Precautions, NPO status , Patient's Chart, lab work & pertinent test results  Airway Mallampati: II  TM Distance: >3 FB Neck ROM: Full    Dental no notable dental hx.    Pulmonary neg pulmonary ROS,    Pulmonary exam normal breath sounds clear to auscultation       Cardiovascular hypertension, negative cardio ROS Normal cardiovascular exam Rhythm:Regular Rate:Normal     Neuro/Psych negative neurological ROS  negative psych ROS   GI/Hepatic negative GI ROS, Neg liver ROS,   Endo/Other  negative endocrine ROSdiabetes, Type 2, Oral Hypoglycemic Agents  Renal/GU negative Renal ROS  negative genitourinary   Musculoskeletal negative musculoskeletal ROS (+)   Abdominal   Peds negative pediatric ROS (+)  Hematology negative hematology ROS (+)   Anesthesia Other Findings   Reproductive/Obstetrics negative OB ROS                             Anesthesia Physical Anesthesia Plan  ASA: III  Anesthesia Plan: General   Post-op Pain Management:    Induction: Intravenous  Airway Management Planned: Oral ETT  Additional Equipment:   Intra-op Plan:   Post-operative Plan: Extubation in OR  Informed Consent: I have reviewed the patients History and Physical, chart, labs and discussed the procedure including the risks, benefits and alternatives for the proposed anesthesia with the patient or authorized representative who has indicated his/her understanding and acceptance.   Dental advisory given  Plan Discussed with: CRNA  Anesthesia Plan Comments:         Anesthesia Quick Evaluation

## 2016-09-13 NOTE — Anesthesia Postprocedure Evaluation (Signed)
Anesthesia Post Note  Patient: Roger Roberts  Procedure(s) Performed: Procedure(s) (LRB): XI ROBOTIC ASSISTED LAPAROSCOPIC RADICAL PROSTATECTOMY  WITH BILATERAL PELVIC NODE DISSECTION (Bilateral)  Patient location during evaluation: PACU Anesthesia Type: General Level of consciousness: awake and alert Pain management: pain level controlled Vital Signs Assessment: post-procedure vital signs reviewed and stable Respiratory status: spontaneous breathing, nonlabored ventilation and respiratory function stable Cardiovascular status: blood pressure returned to baseline and stable Postop Assessment: no signs of nausea or vomiting Anesthetic complications: no       Last Vitals:  Vitals:   09/13/16 1215 09/13/16 1230  BP: (!) 147/76 (!) 142/88  Pulse: 84 77  Resp: 15 (!) 1  Temp:      Last Pain:  Vitals:   09/13/16 1230  TempSrc:   PainSc: Asleep                 Lynda Rainwater

## 2016-09-13 NOTE — Op Note (Signed)
Preoperative diagnosis:  1. Prostate Cancer   Postoperative diagnosis:  1. same   Procedure: 1. Robotic assisted laparoscopic radical prostatectomy 2. Bilateral pelvic lymph node dissection  Surgeon: Ardis Hughs, MD First Assistant: Debbrah Alar, PA Resident Surgeon: Cleotis Lema, MD  Anesthesia: General  Complications: None  Intraoperative findings: Large bladder neck, tapered as part of the anastomosis, small leak anteriorly.  Bilateral nerve sparing procedure.     EBL: 50cc  Specimens:  #1.  Prostate and seminal vesicals #2.  Bilateral pelvic lymph nodes  Indication: Roger Roberts is a 61 y.o. patient with prostate cancer.  After reviewing the management options for treatment, he elected to proceed with the removal of his prostate. We have discussed the potential benefits and risks of the procedure, side effects of the proposed treatment, the likelihood of the patient achieving the goals of the procedure, and any potential problems that might occur during the procedure or recuperation. Informed consent has been obtained.  Description of procedure:  An assistant was required for this surgical procedure.  The duties of the assistant included but were not limited to suctioning, passing suture, camera manipulation, retraction. This procedure would not be able to be performed without an Environmental consultant.  The patient was consented in the preoperative holding area. He was in brought back to the operating room placed the table in supine position. General anesthesia was then induced and endotracheal tube was inserted. He was then placed in dorsolithotomy position and placed in steep Trendelenburg. He was then prepped and draped in the routine sterile fashion. We, the first assistant and I, then began by making a 10 mm incision supraumbilical midline incision the skin down through into the peritoneum. Then placed a 8 mm trocar. I then inflated the abdomen and inserted the 0 robotic lens. We  then placed 2 additional a 8 millimeter trochars in the patient's left lower abdomen proximally 9 cm apart and 2 trochars on the patient's right lower abdomen, one was an 8 mm trocar and the one most lateral was a 12 mm trocar which was used as the assistant port. A 5 mm trocar was placed by triangulating the 2 right lateral ports as a second assistant port. These ports were all placed under visual guidance. Once the ports were noted to be satisfactory position the robot was docked. We started with the 0 lens, monopolar scissors in the right hand and the [place] forceps the left hand as well as a fenestrated grasper as the third arm on the left-hand side.   We, the first assistant and I,  began our dissection the posterior plane incising the peritoneum at the level of the vas deferens. Isolated the left vas deferens and dissected it proximally towards the spermatic cord for 5 cm prior to ligating it. Then used this as traction to isolate the left the seminal vesicle which was then undressed bluntly and completely dissected out, all vessels were cauterized with a combination of bipolar and the monopolar scissors. We then turned our attention to the right side and similarly dissected out the right vas deferens and seminal vesicle. Once the SVs had been freed, we turned our attention to the posterior plane and bluntly dissected the tissue between the rectum and the posterior wall of the prostate bluntly out towards the apex.    At this point the bladder was taken down starting at the urachal remnant with a combination of both blunt dissection and sharp dissection using monopolar cautery the bladder was dropped down  in the usual fashion to the medial umbilical ligaments laterally and the dorsal vein of the prostate anteriorly creating our space of Retzius. We then turned our attention to the endopelvic fascia which was incised laterally starting on the patient's right-hand side the levator muscles were pushed off  the prostate laterally up towards the dorsal vein complex on the right-hand side. This process was then repeated on the left-hand side and a nice notch was created for the dorsal vein. I then used a 43mm stapler to staple the dorsal vein.   We,the first assistant and I, then located the bladder neck at the vesicoprostatic junction and using the monopolar scissors dissected down through the perivesical tissues and the bladder neck down to the prostatic urethra. The catheter was then deflated and pulled through our urethral opening and then used to retract the prostate anteriorly for the posterior bladder neck dissection. Once through the bladder neck and into the posterior plane of the prostate, the SVs were brought through the opening. The left pedicle was then isolated and systematically ligated with Weck clips and scissors. The nerve bundle was then peeled off the posterior lateral aspect of the prostate and bluntly dissected away off the prostate.  This was then repeated on the right side.    I then came down through the dorsal venous complex anteriorly down to the membranous urethra using the monopolar. Once down to the urethra, it was transected sharply and the apex of the prostate was then dissected off the levator and rectourethralis muscles. Once the apex of the prostate had been dissected free we came back to the base of the prostate and bluntly push the rectum and nerve vascular bundle off the prostate the patient's left and used clips on the patient's right to free the prostate. Once the prostate was free it was placed off to the side. The pelvis was then irrigated with normal saline and noted to be relatively hemostatic.  Attention was then turned to the right pelvic sidewall. The fibrofatty tissue between the external iliac vein, confluence of the iliac vessels, hypogastric artery, and Cooper's ligament was dissected free from the pelvic sidewall with care to preserve the obturator nerve. Weck  clips were used for lymphostasis and hemostasis. An identical procedure was performed on the contralateral side and the lymphatic packets were removed for permanent pathologic analysis.  The prostate and both lymph node tissues were placed in the Endo Catch bag and the string brought to the 5 mm port.    The vesicourethral anastomosis was then completed with 2 interlocking 3-0 V. lock sutures running the anastomosis in the 6:00 position to the 12:00 position on each side and then tying it off on the top. The final catheter was then passed through the patient's urethra and into the bladder and 120 cc was instilled into the bladder to test the anastomosis. As there was no leak a 77 [place] Blake drain was passed through the left lateral port and placed around the vesicourethral anastomosis. A 12 mm assistant port on the right lateral side was then closed with 0 Vicryl with the help of the [employer] needle. The 12 mm midline infraumbilical incision was then extended another centimeter taken down and the fascia opened to remove the Endo Catch bag with the prostate specimen. The fascia was then closed with a 0 Vicryl and all skin ports were closed with 4-0 Monocryl in a subcutaneous fashion. Dermabond glue was then applied to the incisions. The drain was then secured  to the skin with a 0 nylon stitch and dressing applied.   At the end of the case all laps needles and sponges had been accounted for. There no immediate complications. The patient returned to the PACU in stable condition.

## 2016-09-13 NOTE — Discharge Instructions (Signed)

## 2016-09-13 NOTE — H&P (Signed)
Elevated PSA  HPI: Roger Roberts is a 61 year-old male established patient who is here further eval and management of an elevated PSA.  The patient's most recent PSA was 5.76. This was drawn on 03/10/2016. Past PSAs: 3.97 on 11/2014, 3.89 on 11/2013, 3.5 on 10/2012, 2.4 on 09/2011.   The patient denies any progression of his voiding symptoms. The patient denies any new bone pain, new back pain, or lower extremity edema. He does not have a history of prostatitis.   He has not had a prostate biopsy done. The patient states he does not take 5 alpha reductase inhibitor medication.   Patient does not have a family history of prostate cancer.   The patient has 8/12 cores positive for prostate cancer. 1/12 cores is Gleason 3+4 equal 7. 7/12 cores are 3+3 equal 6. The highest burden is at the base of the prostate.  Truss volume: 23 g   The patient is a non-insulin-dependent diabetic, his last A1c was 7.5. He has no cardiac history. He is otherwise in reasonably good shape, is up and on his feet all day. He works as an Cabin crew.   The patient has no surgical history.     AUA Symptom Score: Less than 50% of the time he has the sensation of not emptying his bladder completely when finished urinating. Less than 20% of the time he has to urinate again fewer than two hours after he has finished urinating. 50% of the time he has to start and stop again several times when he urinates. He never finds it difficult to postpone urination. Less than 50% of the time he has a weak urinary stream. He never has to push or strain to begin urination. He has to get up to urinate 1 time from the time he goes to bed until the time he gets up in the morning.   Calculated AUA Symptom Score: 9    IIEF-5 Score: The patient's confidence that he can get an erection is moderate. The patient's erections were hard enough for penetration a few times. The patient was able to maintain his erection after he had penetrated his partner  sometimes. During sexual intercouse, it was difficult to maintain his erection to the completion of intercourse. The patient found sexual intercourse satisfactory a few times.   Calculated IIEF-5 Symptom Score: 13    QOL Score: He would feel mostly satisfied if he had to live with his urinary condition the way it is now for the rest of his life.   Calculated QOL Symptom Score: 2    ALLERGIES: No Allergies    MEDICATIONS: Metformin Hcl 500 mg tablet  Simvastatin 40 mg tablet  Viagra  Glipizide 5 mg tablet  Lo-Dose Aspirin Ec 81 mg tablet, delayed release  Losartan Potassium 100 mg tablet  Multivitamin  Pioglitazone Hcl 45 mg tablet     GU PSH: Prostate Needle Biopsy - 07/13/2016    NON-GU PSH: Surgical Pathology, Gross And Microscopic Examination For Prostate Needle - 07/13/2016    GU PMH: Elevated PSA, The patient has an elevated PSA. It was last checked in September. Given his age and his radius. As it increased risk for prostate cancer and combining his PSA today. However, this needs to be repeated. - 05/09/2016    NON-GU PMH: Arthritis Diabetes Type 2    FAMILY HISTORY: 1 Daughter - Daughter 5 sons - Son   SOCIAL HISTORY: Marital Status: Married Current Smoking Status: Patient has never smoked.  Does drink.  Drinks 3 caffeinated drinks per day. 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    REVIEW OF SYSTEMS:    GU Review Male:   Patient denies frequent urination, hard to postpone urination, burning/ pain with urination, get up at night to urinate, leakage of urine, stream starts and stops, trouble starting your stream, have to strain to urinate , erection problems, and penile pain.  Gastrointestinal (Upper):   Patient denies nausea, vomiting, and indigestion/ heartburn.  Gastrointestinal (Lower):   Patient denies diarrhea and constipation.  Constitutional:   Patient denies fever, night sweats, weight loss, and fatigue.  Skin:   Patient denies skin rash/ lesion and  itching.  Eyes:   Patient denies blurred vision and double vision.  Ears/ Nose/ Throat:   Patient denies sore throat and sinus problems.  Hematologic/Lymphatic:   Patient denies swollen glands and easy bruising.  Cardiovascular:   Patient denies leg swelling and chest pains.  Respiratory:   Patient denies cough and shortness of breath.  Endocrine:   Patient denies excessive thirst.  Musculoskeletal:   Patient denies back pain and joint pain.  Neurological:   Patient denies headaches and dizziness.  Psychologic:   Patient denies anxiety and depression.   Notes: The patient states that he has occasional/intermittent dysuria. He denies any gross hematuria   VITAL SIGNS: None   MULTI-SYSTEM PHYSICAL EXAMINATION:    Constitutional: Well-nourished. No physical deformities. Normally developed. Good grooming.  Neck: Neck symmetrical, not swollen. Normal tracheal position.  Respiratory: No labored breathing, no use of accessory muscles. Clear to auscultation  Cardiovascular: Normal temperature, normal extremity pulses, no swelling, no varicosities. Regular rate and rhythm  Lymphatic: No enlargement of neck, axillae, groin.  Skin: No paleness, no jaundice, no cyanosis. No lesion, no ulcer, no rash.  Neurologic / Psychiatric: Oriented to time, oriented to place, oriented to person. No depression, no anxiety, no agitation.  Gastrointestinal: No mass, no tenderness, no rigidity, non obese abdomen.  Eyes: Normal conjunctivae. Normal eyelids.  Ears, Nose, Mouth, and Throat: Left ear no scars, no lesions, no masses. Right ear no scars, no lesions, no masses. Nose no scars, no lesions, no masses. Normal hearing. Normal lips.  Musculoskeletal: Normal gait and station of head and neck.     PAST DATA REVIEWED:  Source Of History:  Patient  Records Review:   Pathology Reports, Previous Patient Records   05/09/16  PSA  Total PSA 4.42 ng/dl  Free PSA 0.63 ng/dl  % Free PSA 14 %    PROCEDURES:           Urinalysis w/Scope Dipstick Dipstick Cont'd Micro  Color: Yellow Bilirubin: Neg WBC/hpf: NS (Not Seen)  Appearance: Clear Ketones: Neg RBC/hpf: 3 - 10/hpf  Specific Gravity: 1.025 Blood: 2+ Bacteria: NS (Not Seen)  pH: 5.5 Protein: Neg Cystals: NS (Not Seen)  Glucose: Neg Urobilinogen: 0.2 Casts: NS (Not Seen)    Nitrites: Neg Trichomonas: Not Present    Leukocyte Esterase: Neg Mucous: Not Present      Epithelial Cells: 0 - 5/hpf      Yeast: NS (Not Seen)      Sperm: Not Present    ASSESSMENT:      ICD-10 Details  1 GU:   Elevated PSA - R97.20   2   Prostate Cancer - C61 The patient has intermediate risk prostate cancer   PLAN:           Schedule Return Visit/Planned Activity: 6-8 Weeks - Schedule Surgery  Return Visit/Planned Activity: ASAP -  PT/OT Referral          Document Letter(s):  Created for Patient: Clinical Summary    We discussed prostatectomy and specifically robotic prostatectomy with bilateral pelvic lymphadenectomy. I showed the patient on their abdomen the approximately 6 small incision (trocar) sites as well as presumed extraction sites with robotic approach as well as possible open incision sites should open conversion be necessary. We discussed peri-operative risks including bleeding, infection, deep vein thrombosis, pulmonary embolism, compartment syndrome, neuropathy / neuropraxia, heart attack, stroke, death, as well as long-term risks such as non-cure / need for additional therapy. We specifically addressed that the procedure would compromise urinary control leading to stress incontinence which typically resolves with time and pelvic rehabilitation (Kegel's, etc..), but can sometimes be permanent and require additional therapy including surgery. We also specifically addressed sexual side-effects including significant erectile dysfunction which typically partially resolves with time but can also be permanent and require additional therapy including surgery.    We discussed the typical hospital course including usual 1-2 night hospitalization, discharge with foley catheter in place usually for 1-2 weeks before voiding trial as well as usually 2 week recovery until able to perform most non-strenuous activity and 6 weeks until able to return to most jobs and more strenuous activity such as exercise.         Notes:   We discussed the treatment options in detail, and I strongly suggested the patient consider surgery given his age and volume of disease. We would plan to perform a bilateral nerve sparing prostatectomy.

## 2016-09-13 NOTE — Discharge Summary (Signed)
Physician Discharge Summary  Patient ID: Roger Roberts MRN: [id number] DOB/AGE: 61-Sep-1957 61 y.o.  Admit date: 09/13/2016 Discharge date: 09/15/2016  Admission Diagnoses: Prostate cancer  Discharge Diagnoses: Prostate cancer  Discharged Condition: good  Hospital Course:  Roger Roberts is a 61 y.o. male who is s/p RAL prostatectomy and bilateral pelvic lymph node dissection on 09/14/2015 with Dr. Louis Meckel for prostate cancer.  The patient tolerated the procedure well, was extubated in the OR and taken to the recovery unit for routine postoperative care. They were then transferred to the floor. Postoperatively he had increased JP drain output overnight on POD0-1 and his JP was placed to gravity on POD1. His Hb normalized on POD1. His drain out put slowed down significantly over the next 24 hours and at time of discharge output was ~30cc over 18 hours.  It was removed prior to discharge.  By POD2 he had met the usual goals for discharge including ambulating at a preoperative capacity, having pain controlled with PO PRN medications, and tolerating a regular diet.  The patient will follow up with [facility] on 09/20/16. They will be discharged with prescriptions for pain medication and antibiotic to take prior to removal of his catheter.    Consults: None  Significant Diagnostic Studies:   Recent Labs  09/14/16 0511 09/14/16 1502 09/15/16 0446  HGB 10.2* 10.4* 9.9*  HCT 31.9* 31.6* 29.0*    Recent Labs  09/14/16 1502  NA 134*  K 4.5  CL 102  CO2 27  GLUCOSE 221*  BUN 13  CREATININE 1.09  CALCIUM 8.2*   No results for input(s): LABPT, INR in the last 72 hours. No results for input(s): PSA in the last 72 hours. No results for input(s): LABURIN in the last 72 hours. Results for orders placed or performed during the hospital encounter of 09/08/16  Urine culture     Status: None   Collection Time: 09/08/16  9:32 AM  Result Value Ref Range Status   Specimen Description  URINE, CLEAN CATCH  Final   Special Requests NONE  Final   Culture   Final    NO GROWTH Performed at [facility], [place] [address] [place], [place] [address]    Report Status 09/09/2016 FINAL  Final     Treatments: surgery: as noted above  Discharge Exam: Blood pressure 130/65, pulse 81, temperature 98.3 F (36.8 C), temperature source Oral, resp. rate 20, height '5\' 8"'$  (1.727 m), weight 72.6 kg (160 lb), SpO2 100 %.   General:  well-developed and well-nourished AAM in NAD, lying in bed, alert & oriented, pleasant HEENT: [place]/AT, EOMI, sclera anicteric, hearing grossly intact, no nasal discharge, MMM Respiratory: nonlabored respirations, satting well on RA, symmetrical chest rise Cardiovascular: pulse regular rate & rhythm Abdominal: soft, NTTP, nondistended, surgical incisions c/d/i without signs of exudate/erythema GU: Foley catheter draining brown urine Extremities: warm, well-perfused, no c/c/e Neuro: no focal deficits   Disposition: Final discharge disposition not confirmed  Discharge Instructions    Discontinue JP/Blake drain    Complete by:  As directed      Allergies as of 09/15/2016   No Known Allergies     Medication List    STOP taking these medications   MULTI COMPLETE PO     TAKE these medications   ACTOS 45 MG tablet Generic drug:  pioglitazone Take 45 mg by mouth daily.   aspirin 81 MG tablet Take 81 mg by mouth daily.   diclofenac sodium 1 % Gel  Commonly known as:  VOLTAREN Apply 2 g topically 4 (four) times daily. Rub into affected area of foot 2 to 4 times daily   glipiZIDE 5 MG 24 hr tablet Commonly known as:  GLUCOTROL XL Take 5 mg by mouth daily with breakfast.   losartan 100 MG tablet Commonly known as:  COZAAR Take 100 mg by mouth every evening.   metFORMIN 500 MG 24 hr tablet Commonly known as:  GLUCOPHAGE-XR Take 1,000 mg by mouth daily with breakfast.   naproxen sodium 220 MG tablet Commonly known as:  ANAPROX Take 440  mg by mouth daily as needed (pain).   simvastatin 40 MG tablet Commonly known as:  ZOCOR Take 40 mg by mouth daily.   sulfamethoxazole-trimethoprim 800-160 MG tablet Commonly known as:  BACTRIM DS,SEPTRA DS Take 1 tablet by mouth 2 (two) times daily. Start the day prior to foley removal appointment   traMADol 50 MG tablet Commonly known as:  ULTRAM Take 1-2 tablets (50-100 mg total) by mouth every 6 (six) hours as needed for moderate pain or severe pain.   VIAGRA 100 MG tablet Generic drug:  sildenafil TAKE 1/2 TO 1 TABLET BY MOUTH AS NEEDED. Take 30-60 minutes prior TO sex, limit USE TO ONE tablet in [place], NP On 09/20/2016.   Specialty:  Nurse Practitioner Why:  at 9:30 Contact information: [address] [place] [address] [phone]           Signed: Louis Meckel W 09/15/2016, 10:04 AM

## 2016-09-14 ENCOUNTER — Encounter (HOSPITAL_COMMUNITY): Payer: Self-pay | Admitting: Urology

## 2016-09-14 DIAGNOSIS — C61 Malignant neoplasm of prostate: Secondary | ICD-10-CM | POA: Diagnosis not present

## 2016-09-14 LAB — BASIC METABOLIC PANEL
ANION GAP: 5 (ref 5–15)
BUN: 13 mg/dL (ref 6–20)
CHLORIDE: 102 mmol/L (ref 101–111)
CO2: 27 mmol/L (ref 22–32)
CREATININE: 1.09 mg/dL (ref 0.61–1.24)
Calcium: 8.2 mg/dL — ABNORMAL LOW (ref 8.9–10.3)
GFR calc non Af Amer: >60 mL/min (ref >60)
Glucose, Bld: 221 mg/dL — ABNORMAL HIGH (ref 65–99)
Potassium: 4.5 mmol/L (ref 3.5–5.1)
SODIUM: 134 mmol/L — AB (ref 135–145)

## 2016-09-14 LAB — GLUCOSE, CAPILLARY
GLUCOSE-CAPILLARY: 154 mg/dL — AB (ref 65–99)
GLUCOSE-CAPILLARY: 182 mg/dL — AB (ref 65–99)
GLUCOSE-CAPILLARY: 134 mg/dL — AB (ref 65–99)
Glucose-Capillary: 183 mg/dL — ABNORMAL HIGH (ref 65–99)
Glucose-Capillary: 120 mg/dL — ABNORMAL HIGH (ref 65–99)

## 2016-09-14 LAB — HEMOGLOBIN AND HEMATOCRIT, BLOOD
HEMATOCRIT: 31.9 % — AB (ref 39.0–52.0)
HEMATOCRIT: 31.6 % — AB (ref 39.0–52.0)
HEMOGLOBIN: 10.2 g/dL — AB (ref 13.0–17.0)
HEMOGLOBIN: 10.4 g/dL — AB (ref 13.0–17.0)

## 2016-09-14 MED ORDER — ACETAMINOPHEN 10 MG/ML IV SOLN
1000.0000 mg | Freq: Four times a day (QID) | INTRAVENOUS | Status: DC
  Administered 2016-09-14 – 2016-09-15 (×3): 1000 mg via INTRAVENOUS
  Filled 2016-09-14 (×4): qty 100

## 2016-09-14 MED ORDER — BISACODYL 10 MG RE SUPP
10.0000 mg | Freq: Once | RECTAL | Status: AC
  Administered 2016-09-14: 10 mg via RECTAL
  Filled 2016-09-14: qty 1

## 2016-09-14 MED ORDER — INSULIN ASPART 100 UNIT/ML ~~LOC~~ SOLN
0.0000 [IU] | Freq: Three times a day (TID) | SUBCUTANEOUS | Status: DC
  Administered 2016-09-14: 3 [IU] via SUBCUTANEOUS
  Administered 2016-09-15: 4 [IU] via SUBCUTANEOUS

## 2016-09-14 NOTE — Progress Notes (Signed)
UROLOGY PROGRESS NOTES  Assessment/Plan: Roger Roberts is a 61 y.o. male with a history of DM, HLD, HTN and prostate cancer s/p RALP with bilateral PLND on 3/28 with Dr. Louis Meckel.   Interval/Plan: AFVSS. NAEON. 860cc total UOP, golden in catheter tubing. 495 total JP output (415 overnight). Hb 10.2 from 12.6. Glucose ranging from 150s to 320s.   - JP drain to gravity - OOB/ambulate/IS - continue clear liquids - consider suppository - consider repeat labs this afternoon keeping in house until 3/30 for concern of anastomotic urine leak   Subjective: Some nausea and thought he would throw up yesterday after ambulating. No flatus or BM yet but + feelings of gas. No fever. Pain 10/10 yesterday, now 5/10. Appears comfortable this morning.   Objective:  Vital signs in last 24 hours: Temp:  [97.4 F (36.3 C)-99.4 F (37.4 C)] 97.4 F (36.3 C) (03/29 5427) Pulse Rate:  [67-91] 67 (03/29 0608) Resp:  [11-18] 18 (03/29 0623) BP: (106-154)/(73-91) 129/77 (03/29 7628) SpO2:  [96 %-100 %] 96 % (03/29 3151)  03/28 0701 - 03/29 0700 In: 5575.8 [P.O.:980; I.V.:3195.8; IV Piggyback:1400] Out: 1610 [Urine:865; Drains:495; Blood:250]    Physical Exam:  General:  well-developed and well-nourished AAM in NAD, lying in bed, alert & oriented, pleasant HEENT: [place]/AT, EOMI, sclera anicteric, hearing grossly intact, no nasal discharge, MMM Respiratory: nonlabored respirations, satting well on RA, symmetrical chest rise Cardiovascular: pulse regular rate & rhythm Abdominal: soft, NTTP, nondistended, surgical incisions c/d/i without signs of exudate/erythema GU: Foley draining golden urine, JP with sanguinous fluid in dressing Extremities: warm, well-perfused, no c/c/e Neuro: no focal deficits   Data Review: Results for orders placed or performed during the hospital encounter of 09/13/16 (from the past 24 hour(s))  Glucose, capillary     Status: Abnormal   Collection Time: 09/13/16 11:46 AM   Result Value Ref Range   Glucose-Capillary 329 (H) 65 - 99 mg/dL   Comment 1 Notify RN    Comment 2 Document in Chart   Hemoglobin and hematocrit, blood     Status: Abnormal   Collection Time: 09/13/16 12:08 PM  Result Value Ref Range   Hemoglobin 12.6 (L) 13.0 - 17.0 g/dL   HCT 38.2 (L) 39.0 - 52.0 %  Glucose, capillary     Status: Abnormal   Collection Time: 09/13/16 12:40 PM  Result Value Ref Range   Glucose-Capillary 316 (H) 65 - 99 mg/dL   Comment 1 Notify RN   Glucose, capillary     Status: Abnormal   Collection Time: 09/13/16  5:41 PM  Result Value Ref Range   Glucose-Capillary 173 (H) 65 - 99 mg/dL  Hemoglobin and hematocrit, blood     Status: Abnormal   Collection Time: 09/14/16  5:11 AM  Result Value Ref Range   Hemoglobin 10.2 (L) 13.0 - 17.0 g/dL   HCT 31.9 (L) 39.0 - 52.0 %    Imaging: None

## 2016-09-15 DIAGNOSIS — C61 Malignant neoplasm of prostate: Secondary | ICD-10-CM | POA: Diagnosis not present

## 2016-09-15 LAB — HEMOGLOBIN AND HEMATOCRIT, BLOOD
HEMATOCRIT: 29 % — AB (ref 39.0–52.0)
Hemoglobin: 9.9 g/dL — ABNORMAL LOW (ref 13.0–17.0)

## 2016-09-15 LAB — GLUCOSE, CAPILLARY: Glucose-Capillary: 158 mg/dL — ABNORMAL HIGH (ref 65–99)

## 2016-09-15 NOTE — Progress Notes (Signed)
POD 2 from prostatectomy  No issues overnight Eating without nausea Passing gas  AFVSS Drain output slowed significantly over last 18 hours (30cc) UOP adequate  Incisions d/c/I Foley draining brown urine  Doing well. D/c JP D/c Home

## 2016-09-15 NOTE — Progress Notes (Signed)
Patient is stable for discharge. Discharge instructions and medications have been reviewed with the patient and his wife and all questions answered. Demonstrated foley/leg bag care. Patient and wife state they understand and demonstrated correct knowledge of foley care.

## 2016-09-15 NOTE — Care Management Note (Signed)
Case Management Note  Patient Details  Name: Roger Roberts MRN: [id number] Date of Birth: 03/12/1956  Subjective/Objective:                    Action/Plan:d/c home.   Expected Discharge Date:  09/15/16               Expected Discharge Plan:  Home/Self Care  In-House Referral:     Discharge planning Services  CM Consult  Post Acute Care Choice:    Choice offered to:     DME Arranged:    DME Agency:     HH Arranged:    HH Agency:     Status of Service:  Completed, signed off  If discussed at [employer] of Stay Meetings, dates discussed:    Additional Comments:  Dessa Phi, RN 09/15/2016, 10:27 AM

## 2016-09-20 DIAGNOSIS — C61 Malignant neoplasm of prostate: Secondary | ICD-10-CM | POA: Diagnosis not present

## 2016-10-06 DIAGNOSIS — C61 Malignant neoplasm of prostate: Secondary | ICD-10-CM | POA: Diagnosis not present

## 2016-10-11 DIAGNOSIS — H903 Sensorineural hearing loss, bilateral: Secondary | ICD-10-CM | POA: Diagnosis not present

## 2016-10-11 DIAGNOSIS — H9313 Tinnitus, bilateral: Secondary | ICD-10-CM | POA: Diagnosis not present

## 2016-10-12 DIAGNOSIS — M6281 Muscle weakness (generalized): Secondary | ICD-10-CM | POA: Diagnosis not present

## 2016-10-12 DIAGNOSIS — M62838 Other muscle spasm: Secondary | ICD-10-CM | POA: Diagnosis not present

## 2016-10-27 DIAGNOSIS — C61 Malignant neoplasm of prostate: Secondary | ICD-10-CM | POA: Diagnosis not present

## 2016-10-27 DIAGNOSIS — M62838 Other muscle spasm: Secondary | ICD-10-CM | POA: Diagnosis not present

## 2016-10-27 DIAGNOSIS — M6281 Muscle weakness (generalized): Secondary | ICD-10-CM | POA: Diagnosis not present

## 2016-11-09 ENCOUNTER — Other Ambulatory Visit: Payer: Self-pay | Admitting: Urology

## 2016-11-09 DIAGNOSIS — M7989 Other specified soft tissue disorders: Principal | ICD-10-CM

## 2016-11-09 DIAGNOSIS — M79605 Pain in left leg: Secondary | ICD-10-CM

## 2016-11-10 ENCOUNTER — Ambulatory Visit (HOSPITAL_COMMUNITY)
Admission: RE | Admit: 2016-11-10 | Discharge: 2016-11-10 | Disposition: A | Discharge status: Home / Self Care | Payer: 59 | Source: Ambulatory Visit | Attending: Family Medicine | Admitting: Family Medicine

## 2016-11-10 DIAGNOSIS — M79605 Pain in left leg: Secondary | ICD-10-CM | POA: Diagnosis not present

## 2016-11-10 DIAGNOSIS — Z8546 Personal history of malignant neoplasm of prostate: Secondary | ICD-10-CM | POA: Insufficient documentation

## 2016-11-10 DIAGNOSIS — M7989 Other specified soft tissue disorders: Secondary | ICD-10-CM | POA: Diagnosis not present

## 2016-11-10 NOTE — Progress Notes (Signed)
*  PRELIMINARY RESULTS* Vascular Ultrasound Left lower extremity venous duplex has been completed.  Preliminary findings: No evidence of deep vein thrombosis in the visualized veins of the left lower extremity.  Negative for baker's cyst in the left.  preliminary results called to Dr. Carlton Adam office at 10:10, given to [place].   Roger Roberts 11/10/2016, 10:10 AM

## 2016-11-21 DIAGNOSIS — M6281 Muscle weakness (generalized): Secondary | ICD-10-CM | POA: Diagnosis not present

## 2016-11-21 DIAGNOSIS — M62838 Other muscle spasm: Secondary | ICD-10-CM | POA: Diagnosis not present

## 2016-11-22 DIAGNOSIS — M25572 Pain in left ankle and joints of left foot: Secondary | ICD-10-CM | POA: Diagnosis not present

## 2016-12-07 DIAGNOSIS — H7291 Unspecified perforation of tympanic membrane, right ear: Secondary | ICD-10-CM | POA: Diagnosis not present

## 2016-12-07 DIAGNOSIS — H9193 Unspecified hearing loss, bilateral: Secondary | ICD-10-CM | POA: Diagnosis not present

## 2016-12-08 DIAGNOSIS — T700XXA Otitic barotrauma, initial encounter: Secondary | ICD-10-CM | POA: Diagnosis not present

## 2016-12-12 DIAGNOSIS — H5712 Ocular pain, left eye: Secondary | ICD-10-CM | POA: Diagnosis not present

## 2016-12-19 DIAGNOSIS — H5712 Ocular pain, left eye: Secondary | ICD-10-CM | POA: Diagnosis not present

## 2017-01-23 DIAGNOSIS — H269 Unspecified cataract: Secondary | ICD-10-CM | POA: Diagnosis not present

## 2017-01-23 DIAGNOSIS — H2513 Age-related nuclear cataract, bilateral: Secondary | ICD-10-CM | POA: Diagnosis not present

## 2017-01-23 DIAGNOSIS — E119 Type 2 diabetes mellitus without complications: Secondary | ICD-10-CM | POA: Diagnosis not present

## 2017-02-13 DIAGNOSIS — I890 Lymphedema, not elsewhere classified: Secondary | ICD-10-CM | POA: Diagnosis not present

## 2017-03-12 DIAGNOSIS — E7800 Pure hypercholesterolemia, unspecified: Secondary | ICD-10-CM | POA: Diagnosis not present

## 2017-03-12 DIAGNOSIS — E1165 Type 2 diabetes mellitus with hyperglycemia: Secondary | ICD-10-CM | POA: Diagnosis not present

## 2017-03-12 DIAGNOSIS — Z0000 Encounter for general adult medical examination without abnormal findings: Secondary | ICD-10-CM | POA: Diagnosis not present

## 2017-03-12 DIAGNOSIS — Z23 Encounter for immunization: Secondary | ICD-10-CM | POA: Diagnosis not present

## 2017-05-08 DIAGNOSIS — C61 Malignant neoplasm of prostate: Secondary | ICD-10-CM | POA: Diagnosis not present

## 2017-07-09 DIAGNOSIS — M25512 Pain in left shoulder: Secondary | ICD-10-CM | POA: Diagnosis not present

## 2017-07-09 DIAGNOSIS — E1165 Type 2 diabetes mellitus with hyperglycemia: Secondary | ICD-10-CM | POA: Diagnosis not present

## 2017-07-09 DIAGNOSIS — I10 Essential (primary) hypertension: Secondary | ICD-10-CM | POA: Diagnosis not present

## 2017-07-09 DIAGNOSIS — Z23 Encounter for immunization: Secondary | ICD-10-CM | POA: Diagnosis not present

## 2017-07-16 DIAGNOSIS — M67912 Unspecified disorder of synovium and tendon, left shoulder: Secondary | ICD-10-CM | POA: Diagnosis not present

## 2017-08-15 DIAGNOSIS — Z1211 Encounter for screening for malignant neoplasm of colon: Secondary | ICD-10-CM | POA: Diagnosis not present

## 2017-08-15 DIAGNOSIS — D126 Benign neoplasm of colon, unspecified: Secondary | ICD-10-CM | POA: Diagnosis not present

## 2017-08-15 DIAGNOSIS — K5730 Diverticulosis of large intestine without perforation or abscess without bleeding: Secondary | ICD-10-CM | POA: Diagnosis not present

## 2017-09-26 DIAGNOSIS — H53122 Transient visual loss, left eye: Secondary | ICD-10-CM | POA: Diagnosis not present

## 2017-09-28 ENCOUNTER — Other Ambulatory Visit: Payer: Self-pay | Admitting: Ophthalmology

## 2017-09-28 DIAGNOSIS — G453 Amaurosis fugax: Secondary | ICD-10-CM

## 2017-10-03 ENCOUNTER — Ambulatory Visit
Admission: RE | Admit: 2017-10-03 | Discharge: 2017-10-03 | Disposition: A | Discharge status: Home / Self Care | Payer: 59 | Source: Ambulatory Visit | Attending: Ophthalmology | Admitting: Ophthalmology

## 2017-10-03 DIAGNOSIS — G453 Amaurosis fugax: Secondary | ICD-10-CM

## 2017-10-15 DIAGNOSIS — E1165 Type 2 diabetes mellitus with hyperglycemia: Secondary | ICD-10-CM | POA: Diagnosis not present

## 2017-10-16 ENCOUNTER — Telehealth: Payer: Self-pay | Admitting: Neurology

## 2017-10-16 ENCOUNTER — Encounter: Payer: Self-pay | Admitting: Neurology

## 2017-10-16 ENCOUNTER — Ambulatory Visit: Payer: 59 | Admitting: Neurology

## 2017-10-16 VITALS — BP 130/83 | HR 87 | Wt 150.5 lb

## 2017-10-16 DIAGNOSIS — H539 Unspecified visual disturbance: Secondary | ICD-10-CM | POA: Diagnosis not present

## 2017-10-16 NOTE — Progress Notes (Signed)
Subjective:    Patient ID: Roger Roberts is a 62 y.o. male.  HPI     [name], MD, PhD [facility] [address], [address]O. [place], [place] [address]  Dear Dr. Radene Ou,   I saw your patient, Roger Roberts, upon your kind request in my neurologic clinic today for initial consultation of his visual disturbance. The patient is unaccompanied today. As you know, Roger Roberts is a 62 year old right-handed gentleman with an underlying medical history of diabetes, hypertension, asthma, hyperlipidemia, history of prostate cancer, allergic rhinitis and low back pain, who reports intermittent vision changes affecting his left eye. He was seen by ophthalmology for this. He had a recent carotid Doppler ultrasound on 10/03/2017 and I reviewed the result: IMPRESSION: Unremarkable carotid Doppler ultrasound.  There was concern for amaurosis fugax. He reported that he had an approximately 1 minute episode of not being able to see fully with the left eye. He had good vision with the right eye. Some 2 years ago he had an episode of vision loss that lasted about 1 minute but affected both eyes. He does not have a history of recurrent migraine auras of migrainous headaches. He had one migraine in his lifetime in the mid 50s he reports. He has a history of chronic tinnitus. He had blood work yesterday, results are pending. He denies any episodic one-sided weakness or numbness or tingling, slurring of speech or droopy face. He does not have recurrent headaches. He has corrective eyeglasses which he reports are up-to-date. He had  a benign eye exam recently. He reports having had a dilated eye exam. He does not report any issues with his sleep, snores occasionally, no apneas or gasping sensations are reported. He currently feels at baseline.  His Past Medical History Is Significant For: Past Medical History:  Diagnosis Date  . Diabetes mellitus without complication ([place])   . Hyperlipidemia    . Hypertension     His Past Surgical History Is Significant For: Past Surgical History:  Procedure Laterality Date  . ROBOT ASSISTED LAPAROSCOPIC RADICAL PROSTATECTOMY Bilateral 09/13/2016   Procedure: XI ROBOTIC ASSISTED LAPAROSCOPIC RADICAL PROSTATECTOMY  WITH BILATERAL PELVIC NODE DISSECTION;  Surgeon: Ardis Hughs, MD;  Location: [facility];  Service: Urology;  Laterality: Bilateral;    His Family History Is Significant For: No family history on file.  His Social History Is Significant For: Social History   Socioeconomic History  . Marital status: Married    Spouse name: Not on file  . Number of children: Not on file  . Years of education: Not on file  . Highest education level: Not on file  Occupational History  . Not on file  Social Needs  . Financial resource strain: Not on file  . Food insecurity:    Worry: Not on file    Inability: Not on file  . Transportation needs:    Medical: Not on file    Non-medical: Not on file  Tobacco Use  . Smoking status: Never Smoker  . Smokeless tobacco: Never Used  Substance and Sexual Activity  . Alcohol use: Yes    Alcohol/week: 0.0 oz    Comment: 1-2 drinks per week   . Drug use: Not on file  . Sexual activity: Not on file  Lifestyle  . Physical activity:    Days per week: Not on file    Minutes per session: Not on file  . Stress: Not on file  Relationships  . Social connections:  Talks on phone: Not on file    Gets together: Not on file    Attends religious service: Not on file    Active member of club or organization: Not on file    Attends meetings of clubs or organizations: Not on file    Relationship status: Not on file  Other Topics Concern  . Not on file  Social History Narrative  . Not on file    His Allergies Are:  No Known Allergies:   His Current Medications Are:  Outpatient Encounter Medications as of 10/16/2017  Medication Sig  . aspirin 81 MG tablet Take 81 mg by mouth daily.  . diclofenac  sodium (VOLTAREN) 1 % GEL Apply 2 g topically 4 (four) times daily. Rub into affected area of foot 2 to 4 times daily  . glipiZIDE (GLUCOTROL XL) 5 MG 24 hr tablet Take 5 mg by mouth daily with breakfast.   . losartan (COZAAR) 100 MG tablet Take 100 mg by mouth every evening.   . metFORMIN (GLUCOPHAGE-XR) 500 MG 24 hr tablet Take 1,000 mg by mouth daily with breakfast.   . naproxen sodium (ANAPROX) 220 MG tablet Take 440 mg by mouth daily as needed (pain).  . pioglitazone (ACTOS) 45 MG tablet Take 45 mg by mouth daily.  . simvastatin (ZOCOR) 40 MG tablet Take 40 mg by mouth daily.  Marland Kitchen sulfamethoxazole-trimethoprim (BACTRIM DS,SEPTRA DS) 800-160 MG tablet Take 1 tablet by mouth 2 (two) times daily. Start the day prior to foley removal appointment  . traMADol (ULTRAM) 50 MG tablet Take 1-2 tablets (50-100 mg total) by mouth every 6 (six) hours as needed for moderate pain or severe pain.  Marland Kitchen VIAGRA 100 MG tablet TAKE 1/2 TO 1 TABLET BY MOUTH AS NEEDED. Take 30-60 minutes prior TO sex, limit USE TO ONE tablet in 24 HOURS   No facility-administered encounter medications on file as of 10/16/2017.   :   Review of Systems:  Out of a complete 14 point review of systems, all are reviewed and negative with the exception of these symptoms as listed below:  Review of Systems  Neurological:       Patient reports that he is having the most trouble with his Left eye, his vision goes in and out at times.     Objective:  Neurological Exam  Physical Exam Physical Examination:   Vitals:   10/16/17 1429  BP: 130/83  Pulse: 87    General Examination: The patient is a very pleasant 62 y.o. male in no acute distress. He appears well-developed and well-nourished and well groomed.   HEENT: Normocephalic, atraumatic, pupils are equal, round and reactive to light and accommodation. He wears corrective eyeglasses. Funduscopic exam is difficult secondary to bilateral cataracts noted. Extraocular tracking is  good, visual field testing is good by finger perimetry. Hearing is grossly intact. Normal smooth pursuit is noted. Face is symmetric with normal facial animation and normal facial sensation. Speech is clear with no dysarthria noted. There is no hypophonia. There is no lip, neck/head, jaw or voice tremor. Neck is supple with full range of passive and active motion. There are no carotid bruits on auscultation. Oropharynx exam reveals: mild mouth dryness, adequate dental hygiene. Tongue protrudes centrally and palate elevates symmetrically. Tonsils are 1+ to 2+ in size.   Chest: Clear to auscultation without wheezing, rhonchi or crackles noted.  Heart: S1+S2+0, regular and normal without murmurs, rubs or gallops noted.   Abdomen: Soft, non-tender and non-distended with normal bowel  sounds appreciated on auscultation.  Extremities: There is no pitting edema in the distal lower extremities bilaterally. Pedal pulses are intact.  Skin: Warm and dry without trophic changes noted.  Musculoskeletal: exam reveals no obvious joint deformities, tenderness or joint swelling or erythema.   Neurologically:  Mental status: The patient is awake, alert and oriented in all 4 spheres. His immediate and remote memory, attention, language skills and fund of knowledge are appropriate. There is no evidence of aphasia, agnosia, apraxia or anomia. Speech is clear with normal prosody and enunciation. Thought process is linear. Mood is normal and affect is normal.  Cranial nerves II - XII are as described above under HEENT exam. In addition: shoulder shrug is normal with equal shoulder height noted. Motor exam: Normal bulk, strength and tone is noted. There is no drift, tremor or rebound. Romberg is negative. Reflexes are 1-2+ throughout. Fine motor skills and coordination: intact with normal finger taps, normal hand movements, normal rapid alternating patting, normal foot taps and normal foot agility.  Cerebellar testing: No  dysmetria or intention tremor on finger to nose testing. Heel to shin is unremarkable bilaterally. There is no truncal or gait ataxia.  Sensory exam: intact to light touch, vibration, temperature sense in the upper and lower extremities.  Gait, station and balance: He stands easily. No veering to one side is noted. No leaning to one side is noted. Posture is age-appropriate and stance is narrow based. Gait shows normal stride length and normal pace. No problems turning are noted. Tandem walk is unremarkable.  Assessment and Plan:    In summary, Roger Roberts is a very pleasant 62 y.o.-year old male with an underlying medical history of diabetes, hypertension, asthma, hyperlipidemia, history of prostate cancer, allergic rhinitis and low back pain, who presents for neurologic consultation of his transient left eye vision disturbance. This has since then resolved. He has a nonfocal neurological exam. He was evaluated by ophthalmology with a full dilated eye exam and visual field testing as I understand. His history is not telltale for migraine aura or migraine headaches. He does have risk factors for TIA/stroke. He had a recent benign carotid Doppler study. I would like to proceed with a brain MRI without and with contrast. So long as his MRI is age-appropriate we will call him with his results and I can see him back on an as-needed basis. He is advised to pursue healthy lifestyle, good diabetes control, follow-up with ophthalmology. He does not have a history or exam concerning for obstructive sleep apnea. I reassured him that his neurological exam is non-focal at this time. He had blood work yesterday with results pending. We may need to request results on his kidney function to be able to pursue his MRI with and without contrast. I suggested as needed follow-up with me. I answered all his questions today and the patient was in agreement.   Thank you very much for allowing me to participate in the care of this  nice patient. If I can be of any further assistance to you please do not hesitate to call me at [phone].  Sincerely,   [name], MD, PhD

## 2017-10-16 NOTE — Patient Instructions (Signed)
Your exam looks good.  I am not sure I can explain your vision issue on the left.  Follow up with your eye doctor if needed.  We will do and MRI brain for completion.  So long as your brain scan looks age-appropriate, I can see you back as needed.

## 2017-10-16 NOTE — Telephone Encounter (Signed)
MR Brain w/wo contrast Dr. Rexene Alberts [facility] Auth: [id number] (exp. 10/16/17 to 11/30/17). Patient is scheduled for 10/24/17 for the [facility] mobile unit.

## 2017-10-24 ENCOUNTER — Ambulatory Visit: Payer: 59

## 2017-10-24 DIAGNOSIS — H539 Unspecified visual disturbance: Secondary | ICD-10-CM | POA: Diagnosis not present

## 2017-10-24 MED ORDER — GADOPENTETATE DIMEGLUMINE 469.01 MG/ML IV SOLN
15.0000 mL | Freq: Once | INTRAVENOUS | Status: AC | PRN
  Administered 2017-10-24: 15 mL via INTRAVENOUS

## 2017-10-25 ENCOUNTER — Telehealth: Payer: Self-pay

## 2017-10-25 NOTE — Telephone Encounter (Signed)
I called pt, advised him that his MRI brain showed age related changes, no acute findings, lesions or strokes, and the contrast uptake was not abnormal. I reminded pt to follow up with ophthalmology and to follow up with [place] as needed. Pt verbalized understanding of results. Pt had no questions at this time but was encouraged to call back if questions arise.

## 2017-10-25 NOTE — Progress Notes (Signed)
MRI brain was in keeping with age related changes. No acute findings, no lesions, no stroke. No abnormal contrast uptake. Overall, reassuring. Pls notify pt. [name], MD, PhD [facility])

## 2017-10-25 NOTE — Telephone Encounter (Signed)
-----   Message from [name], MD sent at 10/25/2017  9:03 AM EDT ----- MRI brain was in keeping with age related changes. No acute findings, no lesions, no stroke. No abnormal contrast uptake. Overall, reassuring. Pls notify pt. [name], MD, PhD [facility])

## 2017-11-26 DIAGNOSIS — Z8546 Personal history of malignant neoplasm of prostate: Secondary | ICD-10-CM | POA: Diagnosis not present

## 2018-02-06 DIAGNOSIS — L03011 Cellulitis of right finger: Secondary | ICD-10-CM | POA: Diagnosis not present

## 2018-02-13 DIAGNOSIS — I10 Essential (primary) hypertension: Secondary | ICD-10-CM | POA: Diagnosis not present

## 2018-02-13 DIAGNOSIS — Z1159 Encounter for screening for other viral diseases: Secondary | ICD-10-CM | POA: Diagnosis not present

## 2018-02-13 DIAGNOSIS — E1165 Type 2 diabetes mellitus with hyperglycemia: Secondary | ICD-10-CM | POA: Diagnosis not present

## 2018-02-13 DIAGNOSIS — E7800 Pure hypercholesterolemia, unspecified: Secondary | ICD-10-CM | POA: Diagnosis not present

## 2018-03-05 DIAGNOSIS — H2513 Age-related nuclear cataract, bilateral: Secondary | ICD-10-CM | POA: Diagnosis not present

## 2018-03-05 DIAGNOSIS — E119 Type 2 diabetes mellitus without complications: Secondary | ICD-10-CM | POA: Diagnosis not present

## 2018-03-05 DIAGNOSIS — H25013 Cortical age-related cataract, bilateral: Secondary | ICD-10-CM | POA: Diagnosis not present

## 2018-03-13 DIAGNOSIS — H25011 Cortical age-related cataract, right eye: Secondary | ICD-10-CM | POA: Diagnosis not present

## 2018-03-13 DIAGNOSIS — H2511 Age-related nuclear cataract, right eye: Secondary | ICD-10-CM | POA: Diagnosis not present

## 2018-03-13 DIAGNOSIS — H2512 Age-related nuclear cataract, left eye: Secondary | ICD-10-CM | POA: Diagnosis not present

## 2018-03-13 DIAGNOSIS — H25012 Cortical age-related cataract, left eye: Secondary | ICD-10-CM | POA: Diagnosis not present

## 2018-03-20 DIAGNOSIS — H2512 Age-related nuclear cataract, left eye: Secondary | ICD-10-CM | POA: Diagnosis not present

## 2018-03-20 DIAGNOSIS — H25012 Cortical age-related cataract, left eye: Secondary | ICD-10-CM | POA: Diagnosis not present

## 2018-05-04 IMAGING — US US CAROTID DUPLEX BILAT
1 series · 13 of 24 positions shown · non-contrast
Comparison: None.

CLINICAL DATA: Amaurosis fugax.  History of diabetes.

EXAM:
BILATERAL CAROTID DUPLEX ULTRASOUND
TECHNIQUE: Gray scale imaging, color Doppler and duplex ultrasound were
performed of bilateral carotid and vertebral arteries in the neck.

[Series 1: us carotid duplex bilat · 0.06mm/px · 13 of 63 slices shown]
[im 1/63]
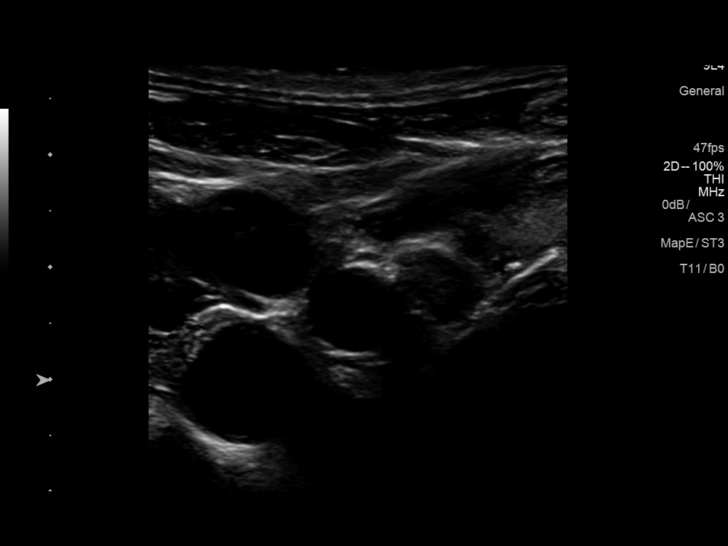
[im 6/63]
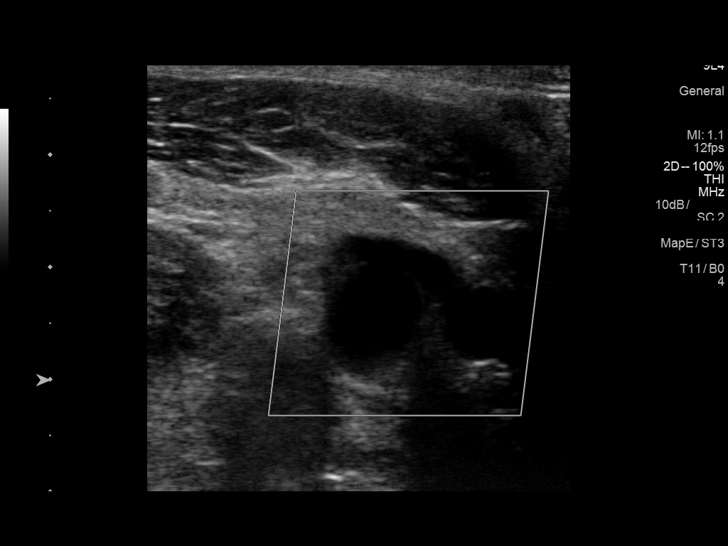
[im 11/63]
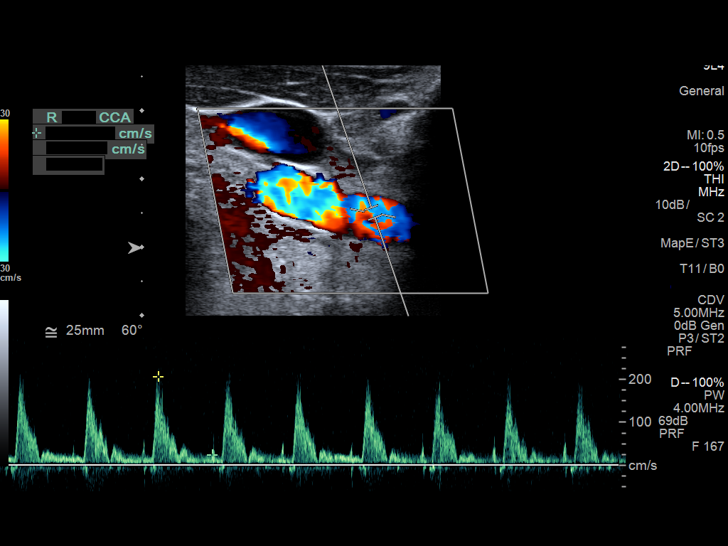
[im 17/63]
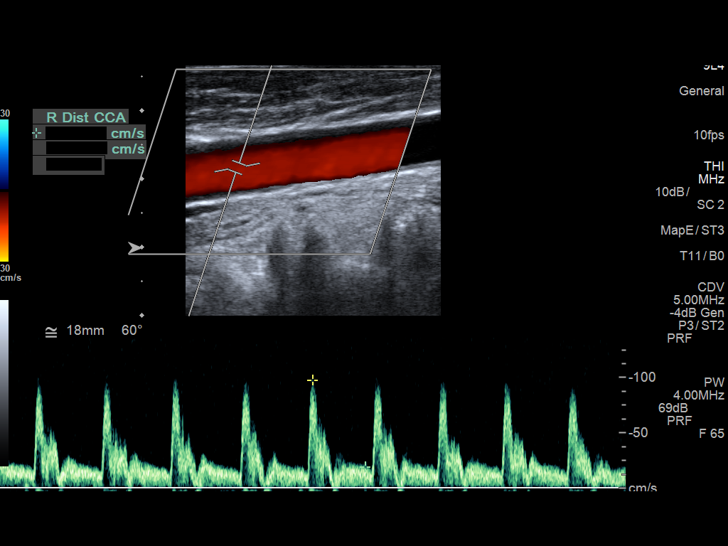
[im 22/63]
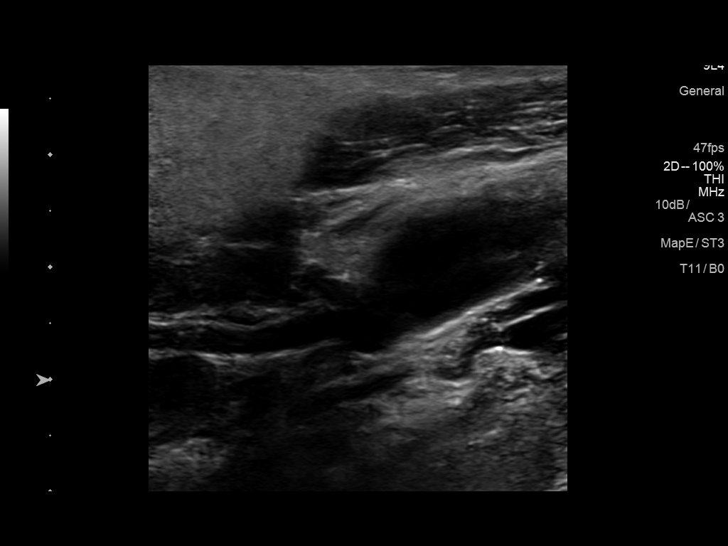
[im 27/63]
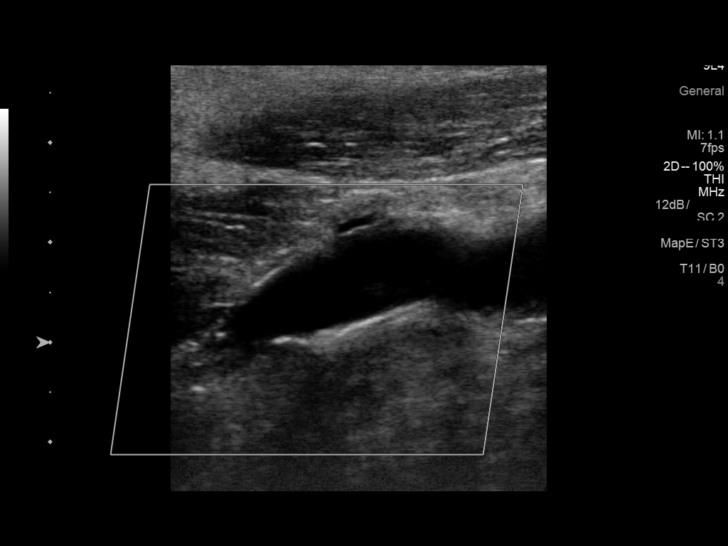
[im 33/63]
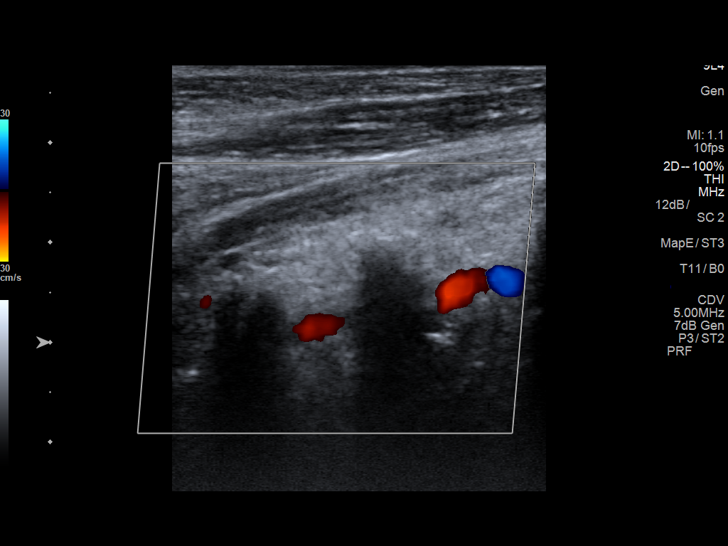
[im 36/63]
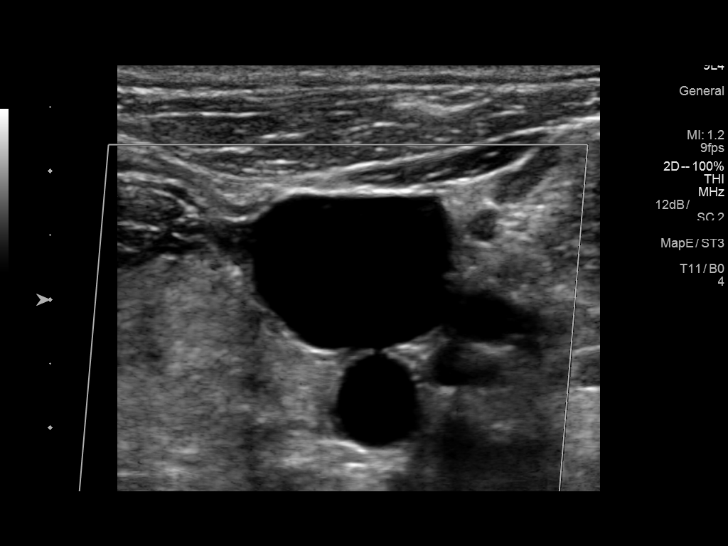
[im 41/63]
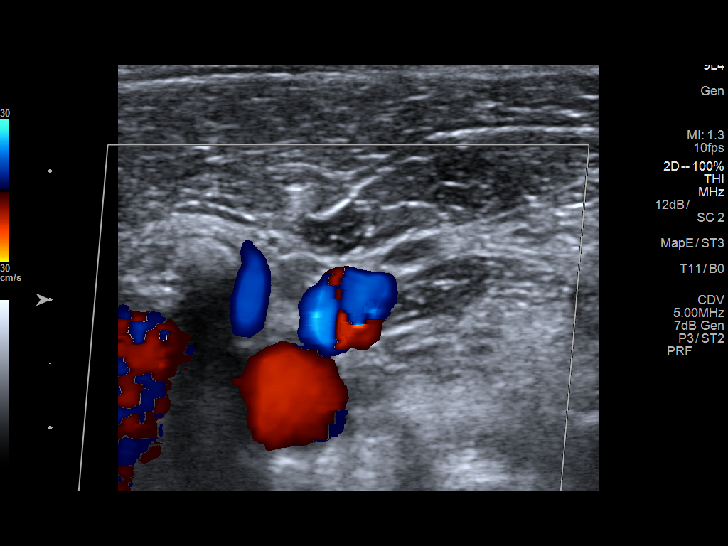
[im 46/63]
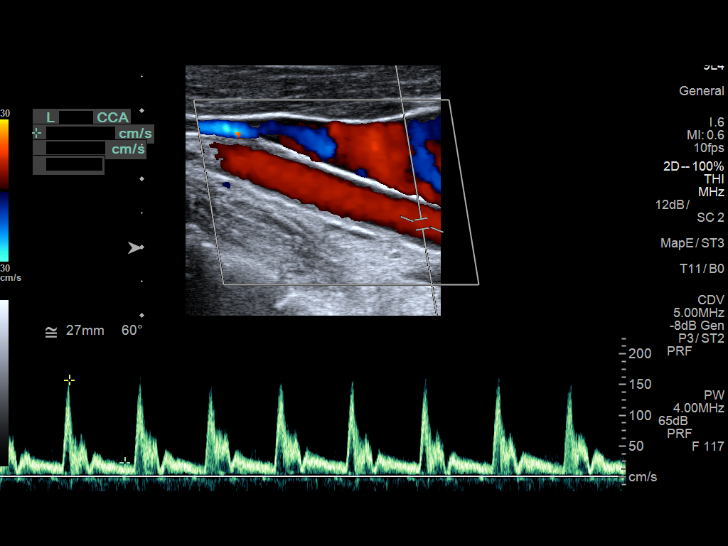
[im 52/63]
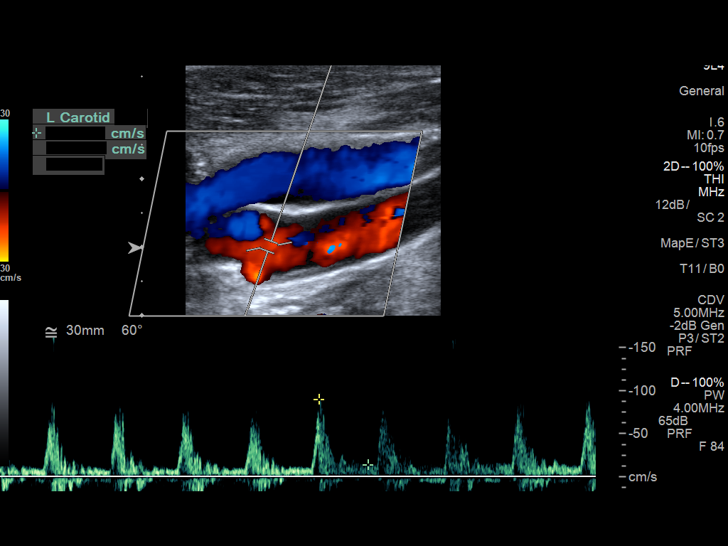
[im 57/63]
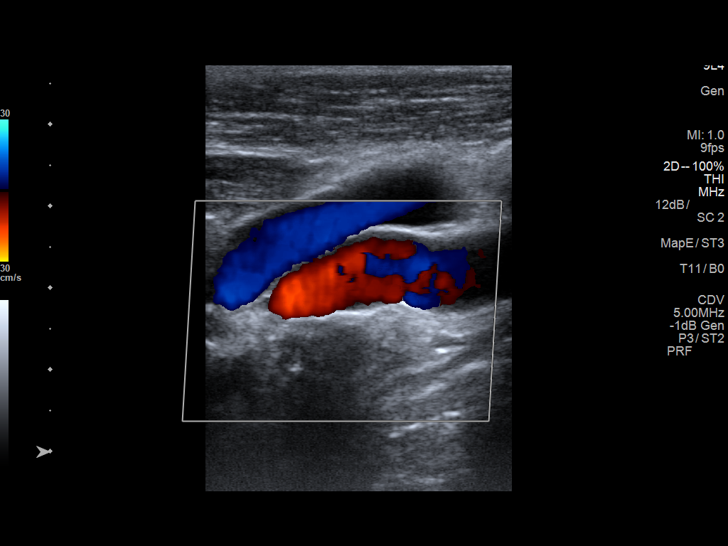
[im 63/63]
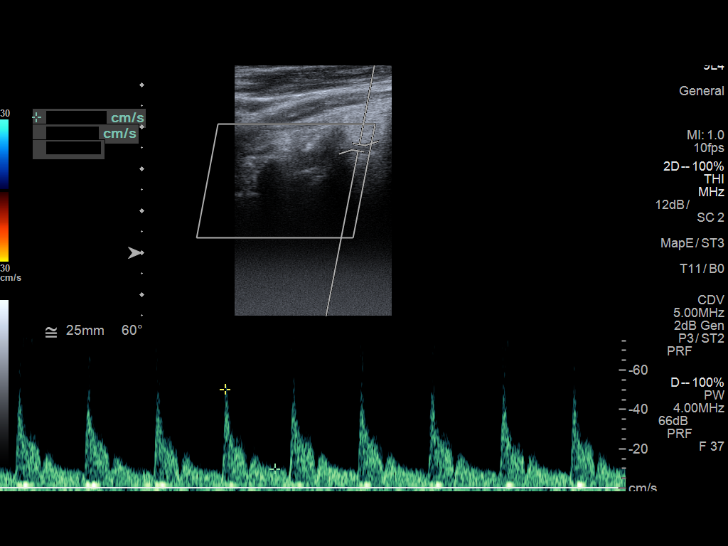

[13 of 24 positions shown; findings below may reference images not displayed]

FINDINGS: Criteria: Quantification of carotid stenosis is based on velocity
parameters that correlate the residual internal carotid diameter
with NASCET-based stenosis levels, using the diameter of the distal
internal carotid lumen as the denominator for stenosis measurement.

The following velocity measurements were obtained:

RIGHT

ICA:  104/30 cm/sec

CCA:  207/25 cm/sec

SYSTOLIC ICA/CCA RATIO:

DIASTOLIC ICA/CCA RATIO:

ECA:  194 cm/sec

LEFT

ICA:  110/34 cm/sec

CCA:  184/32 cm/sec

SYSTOLIC ICA/CCA RATIO:

DIASTOLIC ICA/CCA RATIO:

ECA:  83 cm/sec

RIGHT CAROTID ARTERY: There is no grayscale evidence significant
intimal thickening or atherosclerotic plaque affecting the
interrogated portions of the right carotid system. There are no
elevated peak systolic velocities within the interrogated course of
the right internal carotid artery to suggest a hemodynamically
significant stenosis.

RIGHT VERTEBRAL ARTERY:  Antegrade Flow

LEFT CAROTID ARTERY: There is no grayscale evidence of significant
intimal thickening or atherosclerotic plaque affecting the
interrogated portions of the left carotid system. There are no
elevated peak systolic velocities within the interrogated course of
the left internal carotid artery to suggest a hemodynamically
significant stenosis

LEFT VERTEBRAL ARTERY:  Antegrade flow
IMPRESSION: Unremarkable carotid Doppler ultrasound.

## 2018-05-23 DIAGNOSIS — Z8546 Personal history of malignant neoplasm of prostate: Secondary | ICD-10-CM | POA: Diagnosis not present

## 2018-05-28 DIAGNOSIS — Z23 Encounter for immunization: Secondary | ICD-10-CM | POA: Diagnosis not present

## 2018-05-28 DIAGNOSIS — E1165 Type 2 diabetes mellitus with hyperglycemia: Secondary | ICD-10-CM | POA: Diagnosis not present

## 2018-05-30 DIAGNOSIS — Z8546 Personal history of malignant neoplasm of prostate: Secondary | ICD-10-CM | POA: Diagnosis not present

## 2018-07-25 DIAGNOSIS — Z23 Encounter for immunization: Secondary | ICD-10-CM | POA: Diagnosis not present

## 2018-07-30 DIAGNOSIS — E7800 Pure hypercholesterolemia, unspecified: Secondary | ICD-10-CM | POA: Diagnosis not present

## 2018-07-30 DIAGNOSIS — E1165 Type 2 diabetes mellitus with hyperglycemia: Secondary | ICD-10-CM | POA: Diagnosis not present

## 2018-07-30 DIAGNOSIS — I10 Essential (primary) hypertension: Secondary | ICD-10-CM | POA: Diagnosis not present

## 2018-07-30 DIAGNOSIS — Z0000 Encounter for general adult medical examination without abnormal findings: Secondary | ICD-10-CM | POA: Diagnosis not present

## 2018-08-06 DIAGNOSIS — Z961 Presence of intraocular lens: Secondary | ICD-10-CM | POA: Diagnosis not present

## 2019-03-28 ENCOUNTER — Other Ambulatory Visit: Payer: Self-pay

## 2019-03-28 DIAGNOSIS — Z20822 Contact with and (suspected) exposure to covid-19: Secondary | ICD-10-CM

## 2019-03-29 LAB — NOVEL CORONAVIRUS, NAA: SARS-CoV-2, NAA: NOT DETECTED

## 2022-04-24 ENCOUNTER — Ambulatory Visit (INDEPENDENT_AMBULATORY_CARE_PROVIDER_SITE_OTHER): Payer: 59

## 2022-04-24 ENCOUNTER — Ambulatory Visit: Payer: 59 | Admitting: Podiatry

## 2022-04-24 DIAGNOSIS — M722 Plantar fascial fibromatosis: Secondary | ICD-10-CM | POA: Diagnosis not present

## 2022-04-24 MED ORDER — TRIAMCINOLONE ACETONIDE 10 MG/ML IJ SUSP
10.0000 mg | Freq: Once | INTRAMUSCULAR | Status: AC
  Administered 2022-04-24: 10 mg

## 2022-04-24 NOTE — Patient Instructions (Addendum)
You can roll a frozen water bottle on the area daily.   Rub VOLTAREN GEL on the mass daily. You can purchase this over the counter.   While at your visit today you received a steroid injection in your foot or ankle to help with your pain. Along with having the steroid medication there is some "numbing" medication in the shot that you received. Due to this you may notice some numbness to the area for the next couple of hours.   I would recommend limiting activity for the next few days to help the steroid injection take affect.    The actually benefit from the steroid injection may take up to 2-7 days to see a difference. You may actually experience a small (as in 10%) INCREASE in pain in the first 24 hours---that is common. It would be best if you can ice the area today and take anti-inflammatory medications (such as Ibuprofen, Motrin, or Aleve) if you are able to take these medications. If you were prescribed another medication to help with the pain go ahead and start that medication today    Things to watch out for that you should contact us or a health care provider urgently would include: 1. Unusual (as in more than 10%) increase in pain 2. New fever > 101.5 3. New swelling or redness of the injected area.  4. Streaking of red lines around the area injected.  If you have any questions or concerns about this, please give our office a call at [phone].      Plantar Fasciitis (Heel Spur Syndrome) with Rehab The plantar fascia is a fibrous, ligament-like, soft-tissue structure that spans the bottom of the foot. Plantar fasciitis is a condition that causes pain in the foot due to inflammation of the tissue. SYMPTOMS  Pain and tenderness on the underneath side of the foot. Pain that worsens with standing or walking. CAUSES  Plantar fasciitis is caused by irritation and injury to the plantar fascia on the underneath side of the foot. Common mechanisms of injury include: Direct trauma to  bottom of the foot. Damage to a small nerve that runs under the foot where the main fascia attaches to the heel bone. Stress placed on the plantar fascia due to bone spurs. RISK INCREASES WITH:  Activities that place stress on the plantar fascia (running, jumping, pivoting, or cutting). Poor strength and flexibility. Improperly fitted shoes. Tight calf muscles. Flat feet. Failure to warm-up properly before activity. Obesity. PREVENTION Warm up and stretch properly before activity. Allow for adequate recovery between workouts. Maintain physical fitness: Strength, flexibility, and endurance. Cardiovascular fitness. Maintain a health body weight. Avoid stress on the plantar fascia. Wear properly fitted shoes, including arch supports for individuals who have flat feet.  PROGNOSIS  If treated properly, then the symptoms of plantar fasciitis usually resolve without surgery. However, occasionally surgery is necessary.  RELATED COMPLICATIONS  Recurrent symptoms that may result in a chronic condition. Problems of the lower back that are caused by compensating for the injury, such as limping. Pain or weakness of the foot during push-off following surgery. Chronic inflammation, scarring, and partial or complete fascia tear, occurring more often from repeated injections.  TREATMENT  Treatment initially involves the use of ice and medication to help reduce pain and inflammation. The use of strengthening and stretching exercises may help reduce pain with activity, especially stretches of the Achilles tendon. These exercises may be performed at home or with a therapist. Your caregiver may recommend that you  use heel cups of arch supports to help reduce stress on the plantar fascia. Occasionally, corticosteroid injections are given to reduce inflammation. If symptoms persist for greater than 6 months despite non-surgical (conservative), then surgery may be recommended.   MEDICATION  If pain  medication is necessary, then nonsteroidal anti-inflammatory medications, such as aspirin and ibuprofen, or other minor pain relievers, such as acetaminophen, are often recommended. Do not take pain medication within 7 days before surgery. Prescription pain relievers may be given if deemed necessary by your caregiver. Use only as directed and only as much as you need. Corticosteroid injections may be given by your caregiver. These injections should be reserved for the most serious cases, because they may only be given a certain number of times.  HEAT AND COLD Cold treatment (icing) relieves pain and reduces inflammation. Cold treatment should be applied for 10 to 15 minutes every 2 to 3 hours for inflammation and pain and immediately after any activity that aggravates your symptoms. Use ice packs or massage the area with a piece of ice (ice massage). Heat treatment may be used prior to performing the stretching and strengthening activities prescribed by your caregiver, physical therapist, or athletic trainer. Use a heat pack or soak the injury in warm water.  SEEK IMMEDIATE MEDICAL CARE IF: Treatment seems to offer no benefit, or the condition worsens. Any medications produce adverse side effects.  EXERCISES- RANGE OF MOTION (ROM) AND STRETCHING EXERCISES - Plantar Fasciitis (Heel Spur Syndrome) These exercises may help you when beginning to rehabilitate your injury. Your symptoms may resolve with or without further involvement from your physician, physical therapist or athletic trainer. While completing these exercises, remember:  Restoring tissue flexibility helps normal motion to return to the joints. This allows healthier, less painful movement and activity. An effective stretch should be held for at least 30 seconds. A stretch should never be painful. You should only feel a gentle lengthening or release in the stretched tissue.  RANGE OF MOTION - Toe Extension, Flexion Sit with your right /  left leg crossed over your opposite knee. Grasp your toes and gently pull them back toward the top of your foot. You should feel a stretch on the bottom of your toes and/or foot. Hold this stretch for 10 seconds. Now, gently pull your toes toward the bottom of your foot. You should feel a stretch on the top of your toes and or foot. Hold this stretch for 10 seconds. Repeat  times. Complete this stretch 3 times per day.   RANGE OF MOTION - Ankle Dorsiflexion, Active Assisted Remove shoes and sit on a chair that is preferably not on a carpeted surface. Place right / left foot under knee. Extend your opposite leg for support. Keeping your heel down, slide your right / left foot back toward the chair until you feel a stretch at your ankle or calf. If you do not feel a stretch, slide your bottom forward to the edge of the chair, while still keeping your heel down. Hold this stretch for 10 seconds. Repeat 3 times. Complete this stretch 2 times per day.   STRETCH  Gastroc, Standing Place hands on wall. Extend right / left leg, keeping the front knee somewhat bent. Slightly point your toes inward on your back foot. Keeping your right / left heel on the floor and your knee straight, shift your weight toward the wall, not allowing your back to arch. You should feel a gentle stretch in the right / left calf.  Hold this position for 10 seconds. Repeat 3 times. Complete this stretch 2 times per day.  STRETCH  Soleus, Standing Place hands on wall. Extend right / left leg, keeping the other knee somewhat bent. Slightly point your toes inward on your back foot. Keep your right / left heel on the floor, bend your back knee, and slightly shift your weight over the back leg so that you feel a gentle stretch deep in your back calf. Hold this position for 10 seconds. Repeat 3 times. Complete this stretch 2 times per day.  STRETCH  Gastrocsoleus, Standing  Note: This exercise can place a lot of stress on  your foot and ankle. Please complete this exercise only if specifically instructed by your caregiver.  Place the ball of your right / left foot on a step, keeping your other foot firmly on the same step. Hold on to the wall or a rail for balance. Slowly lift your other foot, allowing your body weight to press your heel down over the edge of the step. You should feel a stretch in your right / left calf. Hold this position for 10 seconds. Repeat this exercise with a slight bend in your right / left knee. Repeat 3 times. Complete this stretch 2 times per day.   STRENGTHENING EXERCISES - Plantar Fasciitis (Heel Spur Syndrome)  These exercises may help you when beginning to rehabilitate your injury. They may resolve your symptoms with or without further involvement from your physician, physical therapist or athletic trainer. While completing these exercises, remember:  Muscles can gain both the endurance and the strength needed for everyday activities through controlled exercises. Complete these exercises as instructed by your physician, physical therapist or athletic trainer. Progress the resistance and repetitions only as guided.  STRENGTH - Towel Curls Sit in a chair positioned on a non-carpeted surface. Place your foot on a towel, keeping your heel on the floor. Pull the towel toward your heel by only curling your toes. Keep your heel on the floor. Repeat 3 times. Complete this exercise 2 times per day.  STRENGTH - Ankle Inversion Secure one end of a rubber exercise band/tubing to a fixed object (table, pole). Loop the other end around your foot just before your toes. Place your fists between your knees. This will focus your strengthening at your ankle. Slowly, pull your big toe up and in, making sure the band/tubing is positioned to resist the entire motion. Hold this position for 10 seconds. Have your muscles resist the band/tubing as it slowly pulls your foot back to the starting  position. Repeat 3 times. Complete this exercises 2 times per day.  Document Released: 06/05/2005 Document Revised: 08/28/2011 Document Reviewed: 09/17/2008 [facility] Patient Information 2014 [place], [place].

## 2022-04-24 NOTE — Progress Notes (Signed)
Subjective:   Patient ID: Roger Roberts, male   DOB: 66 y.o.   MRN: [id number]   HPI Chief Complaint  Patient presents with   soft tissuse mass    Right foot soft tissue mass, started July 4th, 2023, Patient has some pain, rate of pain 6 out of 10, throbbing burning pain Diabetic A1c- 11.0 BG- not taking it, X-Rays taken today    66 year old male presents for above concerns.  Since July 4th weekend, no injury. He came back from the beach and that night he noticed it. No injuries. No treatment. He was hoping it would go away but it hasn't.   He had his A1c checked 2 weeks ago but he does not know the results yet.    Review of Systems  All other systems reviewed and are negative.  Past Medical History:  Diagnosis Date   Diabetes mellitus without complication ([place])    Hyperlipidemia    Hypertension     Past Surgical History:  Procedure Laterality Date   ROBOT ASSISTED LAPAROSCOPIC RADICAL PROSTATECTOMY Bilateral 09/13/2016   Procedure: XI ROBOTIC ASSISTED LAPAROSCOPIC RADICAL PROSTATECTOMY  WITH BILATERAL PELVIC NODE DISSECTION;  Surgeon: Ardis Hughs, MD;  Location: [facility];  Service: Urology;  Laterality: Bilateral;     Current Outpatient Medications:    aspirin 81 MG tablet, Take 81 mg by mouth daily., Disp: , Rfl:    diclofenac sodium (VOLTAREN) 1 % GEL, Apply 2 g topically 4 (four) times daily. Rub into affected area of foot 2 to 4 times daily, Disp: 100 g, Rfl: 2   glipiZIDE (GLUCOTROL XL) 5 MG 24 hr tablet, Take 5 mg by mouth daily with breakfast. , Disp: , Rfl: 0   losartan (COZAAR) 100 MG tablet, Take 100 mg by mouth every evening. , Disp: , Rfl:    metFORMIN (GLUCOPHAGE-XR) 500 MG 24 hr tablet, Take 1,000 mg by mouth daily with breakfast. , Disp: , Rfl: 0   naproxen sodium (ANAPROX) 220 MG tablet, Take 440 mg by mouth daily as needed (pain)., Disp: , Rfl:    pioglitazone (ACTOS) 45 MG tablet, Take 45 mg by mouth daily., Disp: , Rfl:    simvastatin (ZOCOR) 40 MG  tablet, Take 40 mg by mouth daily., Disp: , Rfl:    sulfamethoxazole-trimethoprim (BACTRIM DS,SEPTRA DS) 800-160 MG tablet, Take 1 tablet by mouth 2 (two) times daily. Start the day prior to foley removal appointment, Disp: 6 tablet, Rfl: 0   traMADol (ULTRAM) 50 MG tablet, Take 1-2 tablets (50-100 mg total) by mouth every 6 (six) hours as needed for moderate pain or severe pain., Disp: 30 tablet, Rfl: 0   VIAGRA 100 MG tablet, TAKE 1/2 TO 1 TABLET BY MOUTH AS NEEDED. Take 30-60 minutes prior TO sex, limit USE TO ONE tablet in 24 HOURS, Disp: , Rfl: 1  No Known Allergies        Objective:  Physical Exam  General: AAO x3, NAD  Dermatological: Skin is warm, dry and supple bilateral. There are no open sores, no preulcerative lesions, no rash or signs of infection present.  Vascular: Dorsalis Pedis artery and Posterior Tibial artery pedal pulses are 2/4 bilateral with immedate capillary fill time.  There is no pain with calf compression, swelling, warmth, erythema.   Neruologic: Grossly intact via light touch bilateral.  Musculoskeletal: On the medial band plantar fascia the arch of the foot there is a firm nonmobile mass consistent with plantar fibroma.  Tenderness palpation to the area but  no other areas of discomfort.  Muscular strength 5/5 in all groups tested bilateral.    Gait: Unassisted, Nonantalgic.       Assessment:   Plantar fibromatosis right foot     Plan:  -Treatment options discussed including all alternatives, risks, and complications -Etiology of symptoms were discussed -X-rays were obtained and reviewed with the patient.  3 views of the right foot were obtained.  No evidence of acute fracture or calcifications present. -We discussed the management options both conservative as well as surgical.  Continue conservative care for now.  Discussed steroid injection he wants to proceed.  See procedure note below.  Discussed resting, icing daily.  Discussed Voltaren gel.   Offloading pad dispensed.  Procedure: Injection Tendon/Ligament Discussed alternatives, risks, complications and verbal consent was obtained.  Location: Right plantar fibroma Skin Prep: Alcohol. Injectate: 0.5cc 0.5% marcaine plain, 0.5 cc 2% lidocaine plain and, 1 cc kenalog 10. Disposition: Patient tolerated procedure well. Injection site dressed with a band-aid.  Post-injection care was discussed and return precautions discussed.      Trula Slade DPM

## 2022-05-15 ENCOUNTER — Other Ambulatory Visit: Payer: Self-pay | Admitting: Podiatry

## 2022-05-15 DIAGNOSIS — M722 Plantar fascial fibromatosis: Secondary | ICD-10-CM

## 2022-05-22 ENCOUNTER — Ambulatory Visit: Payer: 59 | Admitting: Podiatry

## 2022-05-22 VITALS — BP 164/79 | HR 70 | Temp 98.4°F | Resp 18

## 2022-05-22 DIAGNOSIS — M722 Plantar fascial fibromatosis: Secondary | ICD-10-CM | POA: Diagnosis not present

## 2022-05-22 MED ORDER — TRIAMCINOLONE ACETONIDE 10 MG/ML IJ SUSP
10.0000 mg | Freq: Once | INTRAMUSCULAR | Status: AC
  Administered 2022-05-22: 10 mg

## 2022-05-22 NOTE — Patient Instructions (Signed)

## 2022-05-24 NOTE — Progress Notes (Signed)
Subjective: Chief Complaint  Patient presents with   fibroma    Right foot fibroma, patient is still having some pain, patient states that the injection has helped    66 year old male presents the office with above concerns.  He states the injection was helpful.  He has been using Voltaren gel.  No recent injury or changes otherwise.   Objective: AAO x3, NAD DP/PT pulses palpable bilaterally, CRT less than 3 seconds On the right foot along the medial band of plantar fascia on the arch of the foot there is still firm nodule consistent with plantar fibroma however does appear to be smaller compared to last appointment subjectively his pain is improved.  There is no edema, erythema. No pain with calf compression, swelling, warmth, erythema  Assessment: Plantar fibromatosis with improvement  Plan: -All treatment options discussed with the patient including all alternatives, risks, complications.  -Steroid injection performed to the soft tissue without any complications.  Skin was cleaned with alcohol and mixture 1 cc Kenalog 10, 0.5 cc of Marcaine plain, 0.5 cc of lidocaine plain was infiltrated into the soft tissue mass without complications.  Postinjection care discussed.  Offloading pad were dispensed. -Continue stretching, icing daily as well as Voltaren gel. -Patient encouraged to call the office with any questions, concerns, change in symptoms.   Trula Slade DPM

## 2022-06-22 ENCOUNTER — Ambulatory Visit: Payer: 59 | Admitting: Podiatry

## 2023-10-22 DIAGNOSIS — Z8546 Personal history of malignant neoplasm of prostate: Secondary | ICD-10-CM | POA: Diagnosis not present

## 2023-10-29 DIAGNOSIS — N5231 Erectile dysfunction following radical prostatectomy: Secondary | ICD-10-CM | POA: Diagnosis not present

## 2023-10-29 DIAGNOSIS — N393 Stress incontinence (female) (male): Secondary | ICD-10-CM | POA: Diagnosis not present

## 2023-10-29 DIAGNOSIS — Z8546 Personal history of malignant neoplasm of prostate: Secondary | ICD-10-CM | POA: Diagnosis not present

## 2023-11-06 DIAGNOSIS — Z6822 Body mass index (BMI) 22.0-22.9, adult: Secondary | ICD-10-CM | POA: Diagnosis not present

## 2023-11-06 DIAGNOSIS — I10 Essential (primary) hypertension: Secondary | ICD-10-CM | POA: Diagnosis not present

## 2023-11-06 DIAGNOSIS — E1169 Type 2 diabetes mellitus with other specified complication: Secondary | ICD-10-CM | POA: Diagnosis not present

## 2023-11-06 DIAGNOSIS — E782 Mixed hyperlipidemia: Secondary | ICD-10-CM | POA: Diagnosis not present

## 2024-01-02 DIAGNOSIS — I10 Essential (primary) hypertension: Secondary | ICD-10-CM | POA: Diagnosis not present

## 2024-01-02 DIAGNOSIS — E782 Mixed hyperlipidemia: Secondary | ICD-10-CM | POA: Diagnosis not present

## 2024-01-02 DIAGNOSIS — E1169 Type 2 diabetes mellitus with other specified complication: Secondary | ICD-10-CM | POA: Diagnosis not present

## 2024-01-18 DIAGNOSIS — H35373 Puckering of macula, bilateral: Secondary | ICD-10-CM | POA: Diagnosis not present

## 2024-01-18 DIAGNOSIS — E119 Type 2 diabetes mellitus without complications: Secondary | ICD-10-CM | POA: Diagnosis not present

## 2024-02-13 DIAGNOSIS — E1169 Type 2 diabetes mellitus with other specified complication: Secondary | ICD-10-CM | POA: Diagnosis not present

## 2024-02-13 DIAGNOSIS — E782 Mixed hyperlipidemia: Secondary | ICD-10-CM | POA: Diagnosis not present

## 2024-02-13 DIAGNOSIS — I10 Essential (primary) hypertension: Secondary | ICD-10-CM | POA: Diagnosis not present

## 2024-04-30 DIAGNOSIS — Z0000 Encounter for general adult medical examination without abnormal findings: Secondary | ICD-10-CM | POA: Diagnosis not present

## 2024-04-30 DIAGNOSIS — E1169 Type 2 diabetes mellitus with other specified complication: Secondary | ICD-10-CM | POA: Diagnosis not present

## 2024-04-30 DIAGNOSIS — E782 Mixed hyperlipidemia: Secondary | ICD-10-CM | POA: Diagnosis not present

## 2024-04-30 DIAGNOSIS — Z6823 Body mass index (BMI) 23.0-23.9, adult: Secondary | ICD-10-CM | POA: Diagnosis not present

## 2024-04-30 DIAGNOSIS — N529 Male erectile dysfunction, unspecified: Secondary | ICD-10-CM | POA: Diagnosis not present

## 2024-04-30 DIAGNOSIS — D696 Thrombocytopenia, unspecified: Secondary | ICD-10-CM | POA: Diagnosis not present

## 2024-04-30 DIAGNOSIS — J309 Allergic rhinitis, unspecified: Secondary | ICD-10-CM | POA: Diagnosis not present

## 2024-04-30 DIAGNOSIS — Z8546 Personal history of malignant neoplasm of prostate: Secondary | ICD-10-CM | POA: Diagnosis not present

## 2024-04-30 DIAGNOSIS — Z23 Encounter for immunization: Secondary | ICD-10-CM | POA: Diagnosis not present

## 2024-04-30 DIAGNOSIS — I10 Essential (primary) hypertension: Secondary | ICD-10-CM | POA: Diagnosis not present
